# Patient Record
Sex: Male | Born: 1950 | Hispanic: No | Marital: Married | Smoking: Former smoker
Health system: Southern US, Community
[De-identification: ages and names within clinical notes are randomized; demographics above are authoritative.]

## PROBLEM LIST (undated history)

## (undated) DIAGNOSIS — R351 Nocturia: Secondary | ICD-10-CM

## (undated) DIAGNOSIS — T7840XA Allergy, unspecified, initial encounter: Secondary | ICD-10-CM

## (undated) DIAGNOSIS — Z87898 Personal history of other specified conditions: Secondary | ICD-10-CM

## (undated) DIAGNOSIS — N200 Calculus of kidney: Secondary | ICD-10-CM

## (undated) DIAGNOSIS — Z972 Presence of dental prosthetic device (complete) (partial): Secondary | ICD-10-CM

## (undated) DIAGNOSIS — Z87828 Personal history of other (healed) physical injury and trauma: Secondary | ICD-10-CM

## (undated) DIAGNOSIS — Z973 Presence of spectacles and contact lenses: Secondary | ICD-10-CM

## (undated) DIAGNOSIS — Z87442 Personal history of urinary calculi: Secondary | ICD-10-CM

## (undated) DIAGNOSIS — M1990 Unspecified osteoarthritis, unspecified site: Secondary | ICD-10-CM

## (undated) DIAGNOSIS — I10 Essential (primary) hypertension: Secondary | ICD-10-CM

## (undated) DIAGNOSIS — K219 Gastro-esophageal reflux disease without esophagitis: Secondary | ICD-10-CM

## (undated) DIAGNOSIS — N201 Calculus of ureter: Secondary | ICD-10-CM

## (undated) HISTORY — PX: HERNIA REPAIR: SHX51

## (undated) HISTORY — DX: Allergy, unspecified, initial encounter: T78.40XA

## (undated) HISTORY — PX: CYST EXCISION: SHX5701

## (undated) HISTORY — DX: Essential (primary) hypertension: I10

---

## 2009-10-07 DIAGNOSIS — Z8781 Personal history of (healed) traumatic fracture: Secondary | ICD-10-CM

## 2009-10-07 HISTORY — DX: Personal history of (healed) traumatic fracture: Z87.81

## 2010-02-17 ENCOUNTER — Inpatient Hospital Stay (HOSPITAL_COMMUNITY): Admission: EM | Admit: 2010-02-17 | Discharge: 2010-02-23 | Payer: Self-pay | Admitting: Emergency Medicine

## 2010-02-22 ENCOUNTER — Ambulatory Visit: Payer: Self-pay | Admitting: Physical Medicine & Rehabilitation

## 2010-12-24 LAB — BASIC METABOLIC PANEL
BUN: 14 mg/dL (ref 6–23)
CO2: 35 mEq/L — ABNORMAL HIGH (ref 19–32)
Calcium: 8.7 mg/dL (ref 8.4–10.5)
Chloride: 97 mEq/L (ref 96–112)
Creatinine, Ser: 1.15 mg/dL (ref 0.4–1.5)
Creatinine, Ser: 1.22 mg/dL (ref 0.4–1.5)
GFR calc Af Amer: >60 mL/min (ref >60)
GFR calc Af Amer: >60 mL/min (ref >60)
GFR calc non Af Amer: >60 mL/min (ref >60)
Potassium: 4.1 mEq/L (ref 3.5–5.1)
Sodium: 139 mEq/L (ref 135–145)

## 2010-12-24 LAB — CBC
Hemoglobin: 14.4 g/dL (ref 13.0–17.0)
Hemoglobin: 15.5 g/dL (ref 13.0–17.0)
MCHC: 34.4 g/dL (ref 30.0–36.0)
MCHC: 35.1 g/dL (ref 30.0–36.0)
MCV: 94.7 fL (ref 78.0–100.0)
MCV: 95.5 fL (ref 78.0–100.0)
RBC: 4.38 MIL/uL (ref 4.22–5.81)
RDW: 12.8 % (ref 11.5–15.5)
WBC: 9.9 10*3/uL (ref 4.0–10.5)

## 2010-12-24 LAB — MAGNESIUM: Magnesium: 2 mg/dL (ref 1.5–2.5)

## 2010-12-24 LAB — PHOSPHORUS: Phosphorus: 3 mg/dL (ref 2.3–4.6)

## 2013-02-02 ENCOUNTER — Ambulatory Visit
Admission: RE | Admit: 2013-02-02 | Discharge: 2013-02-02 | Disposition: A | Discharge status: Home / Self Care | Payer: 59 | Source: Ambulatory Visit | Attending: Family Medicine | Admitting: Family Medicine

## 2013-02-02 ENCOUNTER — Other Ambulatory Visit: Payer: Self-pay | Admitting: Family Medicine

## 2013-02-02 DIAGNOSIS — R52 Pain, unspecified: Secondary | ICD-10-CM

## 2013-02-02 DIAGNOSIS — R609 Edema, unspecified: Secondary | ICD-10-CM

## 2013-10-18 ENCOUNTER — Other Ambulatory Visit: Payer: Self-pay | Admitting: Family Medicine

## 2013-10-18 ENCOUNTER — Ambulatory Visit
Admission: RE | Admit: 2013-10-18 | Discharge: 2013-10-18 | Disposition: A | Discharge status: Home / Self Care | Payer: 59 | Source: Ambulatory Visit | Attending: Family Medicine | Admitting: Family Medicine

## 2013-10-18 DIAGNOSIS — M159 Polyosteoarthritis, unspecified: Secondary | ICD-10-CM

## 2014-06-07 ENCOUNTER — Ambulatory Visit: Payer: Self-pay | Admitting: Podiatry

## 2014-06-07 ENCOUNTER — Ambulatory Visit (INDEPENDENT_AMBULATORY_CARE_PROVIDER_SITE_OTHER): Payer: 59

## 2014-06-07 VITALS — BP 135/81 | HR 72 | Resp 14 | Ht 70.0 in | Wt 190.0 lb

## 2014-06-07 DIAGNOSIS — IMO0002 Reserved for concepts with insufficient information to code with codable children: Secondary | ICD-10-CM

## 2014-06-07 DIAGNOSIS — M79661 Pain in right lower leg: Secondary | ICD-10-CM

## 2014-06-07 DIAGNOSIS — M19079 Primary osteoarthritis, unspecified ankle and foot: Secondary | ICD-10-CM

## 2014-06-07 DIAGNOSIS — I10 Essential (primary) hypertension: Secondary | ICD-10-CM | POA: Insufficient documentation

## 2014-06-07 DIAGNOSIS — M79609 Pain in unspecified limb: Secondary | ICD-10-CM

## 2014-06-07 DIAGNOSIS — M5416 Radiculopathy, lumbar region: Secondary | ICD-10-CM

## 2014-06-07 DIAGNOSIS — M7751 Other enthesopathy of right foot: Secondary | ICD-10-CM

## 2014-06-07 DIAGNOSIS — R269 Unspecified abnormalities of gait and mobility: Secondary | ICD-10-CM

## 2014-06-07 DIAGNOSIS — M722 Plantar fascial fibromatosis: Secondary | ICD-10-CM

## 2014-06-07 DIAGNOSIS — M7750 Other enthesopathy of unspecified foot: Secondary | ICD-10-CM

## 2014-06-07 DIAGNOSIS — M19071 Primary osteoarthritis, right ankle and foot: Secondary | ICD-10-CM

## 2014-06-07 NOTE — Patient Instructions (Signed)
Plantar Fasciitis  Plantar fasciitis is a common condition that causes foot pain. It is soreness (inflammation) of the band of tough fibrous tissue on the bottom of the foot that runs from the heel bone (calcaneus) to the ball of the foot. The cause of this soreness may be from excessive standing, poor fitting shoes, running on hard surfaces, being overweight, having an abnormal walk, or overuse (this is common in runners) of the painful foot or feet. It is also common in aerobic exercise dancers and ballet dancers.  SYMPTOMS   Most people with plantar fasciitis complain of:   Severe pain in the morning on the bottom of their foot especially when taking the first steps out of bed. This pain recedes after a few minutes of walking.   Severe pain is experienced also during walking following a long period of inactivity.   Pain is worse when walking barefoot or up stairs  DIAGNOSIS    Your caregiver will diagnose this condition by examining and feeling your foot.   Special tests such as X-rays of your foot, are usually not needed.  PREVENTION    Consult a sports medicine professional before beginning a new exercise program.   Walking programs offer a good workout. With walking there is a lower chance of overuse injuries common to runners. There is less impact and less jarring of the joints.   Begin all new exercise programs slowly. If problems or pain develop, decrease the amount of time or distance until you are at a comfortable level.   Wear good shoes and replace them regularly.   Stretch your foot and the heel cords at the back of the ankle (Achilles tendon) both before and after exercise.   Run or exercise on even surfaces that are not hard. For example, asphalt is better than pavement.   Do not run barefoot on hard surfaces.   If using a treadmill, vary the incline.   Do not continue to workout if you have foot or joint problems. Seek professional help if they do not improve.  HOME CARE INSTRUCTIONS     Avoid activities that cause you pain until you recover.   Use ice or cold packs on the problem or painful areas after working out.   Only take over-the-counter or prescription medicines for pain, discomfort, or fever as directed by your caregiver.   Soft shoe inserts or athletic shoes with air or gel sole cushions may be helpful.   If problems continue or become more severe, consult a sports medicine caregiver or your own health care provider. Cortisone is a potent anti-inflammatory medication that may be injected into the painful area. You can discuss this treatment with your caregiver.  MAKE SURE YOU:    Understand these instructions.   Will watch your condition.   Will get help right away if you are not doing well or get worse.  Document Released: 06/18/2001 Document Revised: 12/16/2011 Document Reviewed: 08/17/2008  [employer] Patient Information 2015 [employer]. This information is not intended to replace advice given to you by your health care provider. Make sure you discuss any questions you have with your health care provider.

## 2014-06-07 NOTE — Progress Notes (Signed)
Subjective:    Patient ID: ESPEN BETHEL, male    DOB: Feb 13, 1951, 63 y.o.   MRN: [id number]  HPI Comments: N ankle pain L right ankle D initial injury 40 years ago O falling from a ladder C popping, pain mostly in the arch when not wearing OTC modified orthotics A concrete and weight bearing T OTC orthotics and increasing the amount of lift in the right  Foot Pain Associated symptoms include myalgias.      Review of Systems  Cardiovascular: Positive for leg swelling.  Musculoskeletal: Positive for gait problem and myalgias.  Skin: Positive for color change.       Lower legs  All other systems reviewed and are negative.      Objective:   Physical Exam 63 year old white male well-developed well-nourished oriented x3 presents at this time with minimal clear history of pain to have originally in the injury action foot injury of the arch posse tender ligament injury or fascia type injury many years ago at this time continues to pain points to the inferior heel and arch area also along the lateral, foot Lisfranc's area and lateral heel also has issues with the knee and as well as tissue with a compress lumbar disc to from a 4 years ago that was treated patient declined surgery at the time for his back pain and shows conservative care. At this time is wearing shoes or boots with multiple layers of insoles OTC insoles and comfort cork cushioned a gel insoles all of which have created a significant lift on the heel area. Patient also indicates that his knee turns out and by raising the outside of the foot it's helping his knee and back. Lower extremity findings reveal neurovascular status is intact pedal pulses are palpable DP +2/4 bilateral PT one over 4 bilateral mild varicosities right more so than left or some pigmented lesions in the right lower leg can't rule out venous stasis patient Baird Lyons never broke her injury or trauma that sides and discolored ever since September of vasculitis  or vascular compromise on the right side. Orthopedic exam reveals rectus foot type bilateral ankle mid tarsus subtalar joint motions no there is degenerative changes with asymmetric joint space during first MTP area Lisfranc for 5 on the right and subtalar joint some obliteration the middle facet however the range of motion showed which is present although slightly diminished asymmetric joint space narrowing of the ankle especially with some slight spurring the medial malleolar area no signs of fracture no significant arthrosis or fusion any joints are noted cannot rule out old injury or fracture there may be some widening of the calcaneus and clinical evaluation the right foot and ankle is slightly wider and in more beefy than the left foot and ankle and heel.       Assessment & Plan:  Assessments at this time patient does have significant plantar fasciitis/heel spur syndrome bilateral right more so than left there is also possibly some capsulitis Lisfranc's and subtalar mid tarsus joint as well the ankle to have some abnormality when legs appear to be equal patient may have some lumbar radiculopathy associated with his compress disc I suggested he seek out care for some conservative your orthopedist or chiropractor some point in the future we'll cross that bridge in the future at this time is overcompensating and created too much of a lift in the heel and stability within his shoes. At this time fascial symptomology noted cannot rule out small tendinitis and generalized  osteopenic changes noted on x-rays as well. This time fascial strapping applied to the right foot remove some of the insoles diabetes PVD Insall in a thin comfort insoles in the shoe with a slight heel wedging or lift about half-inch. Patient actually has more than an inch and a half of lift in his shoes otherwise to at this time we'll reevaluate in a week or 2 and assess the benefit of a possible orthoses in the future check the orthotic  coverage in the interim and patient will continue taking Aleve or ibuprofen or Advil as needed for pain maintain strapping for. Weeks 5 days extra graft Alvan Dame DPM

## 2014-06-15 ENCOUNTER — Ambulatory Visit (INDEPENDENT_AMBULATORY_CARE_PROVIDER_SITE_OTHER): Payer: 59

## 2014-06-15 VITALS — BP 109/75 | HR 86 | Resp 14

## 2014-06-15 DIAGNOSIS — R269 Unspecified abnormalities of gait and mobility: Secondary | ICD-10-CM

## 2014-06-15 DIAGNOSIS — M7751 Other enthesopathy of right foot: Secondary | ICD-10-CM

## 2014-06-15 DIAGNOSIS — M722 Plantar fascial fibromatosis: Secondary | ICD-10-CM

## 2014-06-15 DIAGNOSIS — M79609 Pain in unspecified limb: Secondary | ICD-10-CM

## 2014-06-15 DIAGNOSIS — M79661 Pain in right lower leg: Secondary | ICD-10-CM

## 2014-06-15 DIAGNOSIS — M7750 Other enthesopathy of unspecified foot: Secondary | ICD-10-CM

## 2014-06-15 NOTE — Progress Notes (Signed)
   Subjective:    Patient ID: Raymond Wallace, male    DOB: [birth year], 63 y.o.   MRN: [id number]  HPI Comments: Pt states initially received a lot of relief with the right foot taping, but as the taping loosened he did not get the same amount of comfort.  Pt states did remove a good amount of lifting material from his right shoe with some relief and not a noticeable amount of back discomfort.  Pt presents with a strapping type brace that covers his right arch and ankle and states he does get relief with this brace.      Review of Systems no new findings or systemic changes noted     Objective:   Physical Exam The taping that we help as a fascial strapping patient been applying to his own he'll or breaks thready but OTC. This time based on improvement with fascial strapping patient is a candidate for orthoses orthotics not covered by his insurance will try OTC orthotics such as power step which are dispensed at this time. Instructions for use of power step are also dispensed       Assessment & Plan:  Plantar fasciitis/heel spur syndrome capsulitis responded to taping and should benefit from orthoses power step orthotics dispense written instructions for use are given continue with NSAIDs and icing intermittently during the break in period recheck in one to 2 months if it fails to improve for adjustments are needed  Alvan Dame DPM

## 2014-06-15 NOTE — Patient Instructions (Signed)

## 2017-03-06 ENCOUNTER — Encounter (HOSPITAL_BASED_OUTPATIENT_CLINIC_OR_DEPARTMENT_OTHER): Payer: 59 | Attending: Internal Medicine

## 2017-03-06 DIAGNOSIS — I87331 Chronic venous hypertension (idiopathic) with ulcer and inflammation of right lower extremity: Secondary | ICD-10-CM | POA: Diagnosis not present

## 2017-03-06 DIAGNOSIS — L97812 Non-pressure chronic ulcer of other part of right lower leg with fat layer exposed: Secondary | ICD-10-CM | POA: Diagnosis not present

## 2017-03-06 DIAGNOSIS — I10 Essential (primary) hypertension: Secondary | ICD-10-CM | POA: Insufficient documentation

## 2017-03-11 ENCOUNTER — Other Ambulatory Visit: Payer: Self-pay | Admitting: Internal Medicine

## 2017-03-11 DIAGNOSIS — L97911 Non-pressure chronic ulcer of unspecified part of right lower leg limited to breakdown of skin: Secondary | ICD-10-CM

## 2017-03-12 ENCOUNTER — Ambulatory Visit (HOSPITAL_COMMUNITY)
Admission: RE | Admit: 2017-03-12 | Discharge: 2017-03-12 | Disposition: A | Discharge status: Home / Self Care | Payer: 59 | Source: Ambulatory Visit | Attending: Vascular Surgery | Admitting: Vascular Surgery

## 2017-03-12 DIAGNOSIS — L97911 Non-pressure chronic ulcer of unspecified part of right lower leg limited to breakdown of skin: Secondary | ICD-10-CM | POA: Diagnosis present

## 2017-03-13 ENCOUNTER — Encounter (HOSPITAL_BASED_OUTPATIENT_CLINIC_OR_DEPARTMENT_OTHER): Payer: Self-pay

## 2017-03-13 ENCOUNTER — Encounter (HOSPITAL_BASED_OUTPATIENT_CLINIC_OR_DEPARTMENT_OTHER): Payer: 59 | Attending: Internal Medicine

## 2017-03-13 DIAGNOSIS — I87331 Chronic venous hypertension (idiopathic) with ulcer and inflammation of right lower extremity: Secondary | ICD-10-CM | POA: Diagnosis not present

## 2017-03-13 DIAGNOSIS — I10 Essential (primary) hypertension: Secondary | ICD-10-CM | POA: Diagnosis not present

## 2017-03-13 DIAGNOSIS — L97812 Non-pressure chronic ulcer of other part of right lower leg with fat layer exposed: Secondary | ICD-10-CM | POA: Insufficient documentation

## 2017-03-20 DIAGNOSIS — I87331 Chronic venous hypertension (idiopathic) with ulcer and inflammation of right lower extremity: Secondary | ICD-10-CM | POA: Diagnosis not present

## 2017-03-21 ENCOUNTER — Encounter: Payer: Self-pay | Admitting: Vascular Surgery

## 2017-03-27 DIAGNOSIS — I87331 Chronic venous hypertension (idiopathic) with ulcer and inflammation of right lower extremity: Secondary | ICD-10-CM | POA: Diagnosis not present

## 2017-04-03 DIAGNOSIS — I87331 Chronic venous hypertension (idiopathic) with ulcer and inflammation of right lower extremity: Secondary | ICD-10-CM | POA: Diagnosis not present

## 2017-04-04 ENCOUNTER — Encounter: Payer: Self-pay | Admitting: Vascular Surgery

## 2017-04-04 ENCOUNTER — Ambulatory Visit (INDEPENDENT_AMBULATORY_CARE_PROVIDER_SITE_OTHER): Payer: 59 | Admitting: Vascular Surgery

## 2017-04-04 VITALS — BP 120/74 | HR 74 | Temp 97.4°F | Resp 18 | Ht 68.0 in | Wt 187.0 lb

## 2017-04-04 DIAGNOSIS — I872 Venous insufficiency (chronic) (peripheral): Secondary | ICD-10-CM

## 2017-04-04 NOTE — Progress Notes (Signed)
Patient ID: Raymond Wallace, male   DOB: May 26, 1951, 66 y.o.   MRN: [id number]  Reason for Consult: New Evaluation (venous ulcer right lower extremity)   Referred by Maxwell Caulobson, Michael G, MD  Subjective:     HPI:  Raymond Wallace is a 66 y.o. male presents for evaluation of right medial malleolar wound. This first happened a few months back with a low-grade trauma. He initially was wearing compressive stockings and now has been with compressive bandages. The wound care center for 1 month. He does notice that the wound is healing. He had an episode of cellulitis that has resolved and the wound is now shrinking in both depth and diameter. He does not have any fevers or chills. He does have a history of smoker but not currently. He is nondiabetic. He continues to walking T Swart daily.  Past Medical History:  Diagnosis Date  . Allergy   . Hypertension    No family history on file. Past Surgical History:  Procedure Laterality Date  . HERNIA REPAIR      Short Social History:  Social History  Substance Use Topics  . Smoking status: Former Smoker    Quit date: 04/05/1987  . Smokeless tobacco: Never Used  . Alcohol use No    No Known Allergies  Current Outpatient Prescriptions  Medication Sig Dispense Refill  . FLUTICASONE PROPIONATE  HFA IN Inhale into the lungs.    Marland Kitchen. lisinopril (PRINIVIL,ZESTRIL) 5 MG tablet Take 5 mg by mouth daily.    . Naproxen Sodium (ALEVE PO) Take by mouth.     No current facility-administered medications for this visit.     Review of Systems  Constitutional:  Constitutional negative. Respiratory: Respiratory negative.  Cardiovascular: Positive for leg swelling.  GI: Gastrointestinal negative.  Musculoskeletal: Positive for leg pain.  Skin: Positive for wound.  Neurological: Neurological negative. Hematologic: Hematologic/lymphatic negative.  Psychiatric: Psychiatric negative.        Objective:  Objective   Vitals:   04/04/17 1609  BP:  120/74  Pulse: 74  Resp: 18  Temp: 97.4 F (36.3 C)  TempSrc: Oral  SpO2: 96%  Weight: 187 lb (84.8 kg)  Height: 5\' 8"  (1.727 m)   Body mass index is 28.43 kg/m.  Physical Exam  Constitutional: He is oriented to person, place, and time. He appears well-developed.  HENT:  Head: Normocephalic.  Eyes: Pupils are equal, round, and reactive to light.  Neck: Normal range of motion.  Cardiovascular: Normal rate.   Pulses:      Dorsalis pedis pulses are 2+ on the right side, and 2+ on the left side.       Posterior tibial pulses are 2+ on the right side, and 2+ on the left side.  Abdominal: Soft. He exhibits no mass.  Musculoskeletal: Normal range of motion.  Neurological: He is alert and oriented to person, place, and time.  Skin:  Dark discoloration of skin below knee on right 1cm ulceration inferior to medial malleolus on right    Data: Reflux in the right saphenofemoral junction and greater saphenous vein and small saphenous vein. Greatest diameter on the right greater saphenous vein is 1.15 cm. Greatest diameter small saphenous vein 0.47 m.     Assessment/Plan:     66 year old male with C6 venous disease and greater saphenous vein reflux with large saphenous vein on the right. He does not have evidence of arterial disease although he is a former smoker. He has been in compression for a  period of one month and followed at the wound center. He will follow-up in 2 months to discuss venous ablation. I have discussed continuing following at the wound care center, continuing compression and continuing to work with ambulation. All questions are answered he will follow-up for discussion of gsv ablation.     Maeola Harman MD [facility]

## 2017-04-10 ENCOUNTER — Encounter (HOSPITAL_BASED_OUTPATIENT_CLINIC_OR_DEPARTMENT_OTHER): Payer: 59 | Attending: Internal Medicine

## 2017-04-10 DIAGNOSIS — I87331 Chronic venous hypertension (idiopathic) with ulcer and inflammation of right lower extremity: Secondary | ICD-10-CM | POA: Insufficient documentation

## 2017-04-10 DIAGNOSIS — L97812 Non-pressure chronic ulcer of other part of right lower leg with fat layer exposed: Secondary | ICD-10-CM | POA: Diagnosis not present

## 2017-04-10 DIAGNOSIS — I10 Essential (primary) hypertension: Secondary | ICD-10-CM | POA: Diagnosis not present

## 2017-04-24 DIAGNOSIS — I87331 Chronic venous hypertension (idiopathic) with ulcer and inflammation of right lower extremity: Secondary | ICD-10-CM | POA: Diagnosis not present

## 2017-05-01 DIAGNOSIS — I87331 Chronic venous hypertension (idiopathic) with ulcer and inflammation of right lower extremity: Secondary | ICD-10-CM | POA: Diagnosis not present

## 2017-05-08 ENCOUNTER — Encounter (HOSPITAL_BASED_OUTPATIENT_CLINIC_OR_DEPARTMENT_OTHER): Payer: 59 | Attending: Internal Medicine

## 2017-05-08 DIAGNOSIS — I10 Essential (primary) hypertension: Secondary | ICD-10-CM | POA: Insufficient documentation

## 2017-05-08 DIAGNOSIS — I739 Peripheral vascular disease, unspecified: Secondary | ICD-10-CM | POA: Insufficient documentation

## 2017-05-08 DIAGNOSIS — I87301 Chronic venous hypertension (idiopathic) without complications of right lower extremity: Secondary | ICD-10-CM | POA: Diagnosis not present

## 2017-05-08 DIAGNOSIS — Z872 Personal history of diseases of the skin and subcutaneous tissue: Secondary | ICD-10-CM | POA: Diagnosis not present

## 2017-05-08 DIAGNOSIS — Z09 Encounter for follow-up examination after completed treatment for conditions other than malignant neoplasm: Secondary | ICD-10-CM | POA: Diagnosis present

## 2017-06-04 ENCOUNTER — Encounter: Payer: Self-pay | Admitting: Vascular Surgery

## 2017-06-16 ENCOUNTER — Ambulatory Visit (INDEPENDENT_AMBULATORY_CARE_PROVIDER_SITE_OTHER): Payer: 59 | Admitting: Vascular Surgery

## 2017-06-16 DIAGNOSIS — I83891 Varicose veins of right lower extremities with other complications: Secondary | ICD-10-CM | POA: Diagnosis not present

## 2017-06-16 NOTE — Progress Notes (Signed)
Subjective:     Patient ID: Raymond MessingCharles D Wallace, male   DOB: 1951-03-22, 66 y.o.   MRN: [id number]  HPI This 66 year old male returns for 3 month follow-up regarding his venous stasis ulcer in the right leg and painful varicosities with swelling. He was seen by Dr. Pascal LuxKane 2 months ago and has been in the wound center 1 month longer to treat a stasis ulcer in the right ankle. This did eventually heal 4 weeks ago. He continues to wear long-leg elastic compression stockings 20-30 millimeter gradient, and tried elevation and pain medication. He has aching throbbing and burning discomfort in the prominent varicosities below the knee and the chronic skin changes which have been present for the last few years. He has no symptoms and contralateral left leg.  Past Medical History:  Diagnosis Date  . Allergy   . Hypertension     Social History  Substance Use Topics  . Smoking status: Former Smoker    Quit date: 04/05/1987  . Smokeless tobacco: Never Used  . Alcohol use No    No family history on file.  No Known Allergies   Current Outpatient Prescriptions:  .  FLUTICASONE PROPIONATE  HFA IN, Inhale into the lungs., Disp: , Rfl:  .  lisinopril (PRINIVIL,ZESTRIL) 5 MG tablet, Take 5 mg by mouth daily., Disp: , Rfl:  .  Naproxen Sodium (ALEVE PO), Take by mouth., Disp: , Rfl:   There were no vitals filed for this visit.  There is no height or weight on file to calculate BMI.         Review of Systems Denies chest pain, dyspnea on exertion, PND, orthopnea, hemoptysis    Objective:   Physical Exam There were no vitals taken for this visit.  Gen. well-developed well-nourished male no apparent distress alert and oriented 3 Lungs no rhonchi or wheezing Right leg with severe hyperpigmentation lower half of below the knee segment. Ulceration adjacent to right medial malleolus his essentially completely healed about 1 mm defect. Large bulging varicosities in the medial calf of the great  saphenous system. 1+ chronic edema. 3+ dorsalis pedis pulse palpable.  Today I reviewed the venous reflux exam which was performed on 03/12/2017 in our office which revealed gross reflux throughout a large right great saphenous vein supplying these painful varicosities as well as some reflux in the deep system down to the popliteal vein level. There is no DVT.     Assessment:     Recent healing of venous stasis ulcer right ankle with gross reflux and right great saphenous vein and deep venous system. This is also causing symptoms which are affecting patient's daily living and resistant to conservative measures including lung leg elastic compression stockings 20-30 millimeter gradient, elevation, and ibuprofen--CEAP5    Plan:     Patient needs #1 laser ablation right great saphenous vein +10-20 stab phlebectomy of painful varicosities to be performed as a single procedure We'll proceed with precertification to perform this in the near future for vent further venous stasis ulcers and further progression of his severe skin changes

## 2017-06-25 ENCOUNTER — Other Ambulatory Visit: Payer: Self-pay | Admitting: *Deleted

## 2017-06-25 DIAGNOSIS — I83891 Varicose veins of right lower extremities with other complications: Secondary | ICD-10-CM

## 2017-08-05 ENCOUNTER — Encounter: Payer: Self-pay | Admitting: Vascular Surgery

## 2017-08-05 ENCOUNTER — Ambulatory Visit (INDEPENDENT_AMBULATORY_CARE_PROVIDER_SITE_OTHER): Payer: 59 | Admitting: Vascular Surgery

## 2017-08-05 VITALS — BP 95/62 | HR 72 | Temp 97.3°F | Resp 16 | Ht 68.0 in | Wt 187.0 lb

## 2017-08-05 DIAGNOSIS — I83891 Varicose veins of right lower extremities with other complications: Secondary | ICD-10-CM

## 2017-08-05 HISTORY — PX: ENDOVENOUS ABLATION SAPHENOUS VEIN W/ LASER: SUR449

## 2017-08-05 NOTE — Progress Notes (Signed)
Laser Ablation Procedure    Date: 08/05/2017   Raymond Wallace DOB:1950/11/27  Consent signed: Yes    Surgeon:  Dr. Quita SkyeJames D. Hart RochesterLawson  Procedure: Laser Ablation: right Greater Saphenous Vein  BP 95/62 (BP Location: Left Arm, Patient Position: Sitting, Cuff Size: Normal)   Pulse 72   Temp (!) 97.3 F (36.3 C) (Oral)   Resp 16   Ht 5\' 8"  (1.727 m)   Wt 187 lb (84.8 kg)   SpO2 98%   BMI 28.43 kg/m   Tumescent Anesthesia: 475 cc 0.9% NaCl with 25 cc Lidocaine HCL 2% and 15 cc 8.4% NaHCO3  Local Anesthesia: 6 cc Lidocaine HCL and NaHCO3 (ratio 2:1)  Pulsed Mode: 15 watts, 500ms delay, 1.0 duration  Total Energy:  2395Joules            Total Pulses:  160              Total Time: 2:40    Stab Phlebectomy: 10-20 Sites: Calf  Patient tolerated procedure well    Description of Procedure:  After marking the course of the secondary varicosities, the patient was placed on the operating table in the supine position, and the right leg was prepped and draped in sterile fashion.   Local anesthetic was administered and under ultrasound guidance the saphenous vein was accessed with a micro needle and guide wire; then the mirco puncture sheath was placed.  A guide wire was inserted saphenofemoral junction , followed by a 5 french sheath.  The position of the sheath and then the laser fiber below the junction was confirmed using the ultrasound.  Tumescent anesthesia was administered along the course of the saphenous vein using ultrasound guidance. The patient was placed in Trendelenburg position and protective laser glasses were placed on patient and staff, and the laser was fired at 15 watts continuous mode advancing 1-922mm/second for a total of 2395 joules.   For stab phlebectomies, local anesthetic was administered at the previously marked varicosities, and tumescent anesthesia was administered around the vessels.  Ten to 20 stab wounds were made using the tip of an 11 blade. And using the vein  hook, the phlebectomies were performed using a hemostat to avulse the varicosities.  Adequate hemostasis was achieved.     Steri strips were applied to the stab wounds and ABD pads and thigh high compression stockings were applied.  Ace wrap bandages were applied over the phlebectomy sites and at the top of the saphenofemoral junction. Blood loss was less than 15 cc.  The patient ambulated out of the operating room having tolerated the procedure well.

## 2017-08-05 NOTE — Progress Notes (Signed)
Subjective:     Patient ID: Raymond Wallace, male   DOB: 06/05/1951, 66 y.o.   MRN: [id number]  HPI this 66 year old male had laser ablation of the right great saphenous vein from the proximal calf to near the saphenofemoral junction +10-20 stab phlebectomy of painful varicosities performed under local tumescent anesthesia. A total of 2395 J of energy was utilized. He tolerated the procedures well.   Review of Systems     Objective:   Physical Exam BP 95/62 (BP Location: Left Arm, Patient Position: Sitting, Cuff Size: Normal)   Pulse 72   Temp (!) 97.3 F (36.3 C) (Oral)   Resp 16   Ht 5\' 8"  (1.727 m)   Wt 187 lb (84.8 kg)   SpO2 98%   BMI 28.43 kg/m        Assessment:     Well-tolerated laser ablation right great saphenous vein plus multiple stab phlebectomy of painful varicosities performed under local tumescent anesthesia for painful varicosities and history of venous ulcer    Plan:     Return in 1 week for venous duplex exam to confirm closure right great saphenous vein and this will complete patient's treatment regimen

## 2017-08-12 ENCOUNTER — Ambulatory Visit (HOSPITAL_COMMUNITY)
Admission: RE | Admit: 2017-08-12 | Discharge: 2017-08-12 | Disposition: A | Discharge status: Home / Self Care | Payer: 59 | Source: Ambulatory Visit | Attending: Vascular Surgery | Admitting: Vascular Surgery

## 2017-08-12 ENCOUNTER — Encounter: Payer: Self-pay | Admitting: Vascular Surgery

## 2017-08-12 ENCOUNTER — Ambulatory Visit (INDEPENDENT_AMBULATORY_CARE_PROVIDER_SITE_OTHER): Payer: 59 | Admitting: Vascular Surgery

## 2017-08-12 VITALS — BP 129/85 | HR 84 | Temp 97.6°F | Resp 18 | Ht 68.0 in | Wt 187.0 lb

## 2017-08-12 DIAGNOSIS — I83891 Varicose veins of right lower extremities with other complications: Secondary | ICD-10-CM

## 2017-08-12 NOTE — Progress Notes (Signed)
Subjective:     Patient ID: Raymond Wallace, male   DOB: 1951-05-03, 66 y.o.   MRN: [id number]  HPI This 66 year old male returns 1 week post-laser ablation right great saphenous vein with multiple stab phlebectomy of painful varicosities. He has worn long leg elastic compression stocking as instructed and taken ibuprofen. He took the ibuprofen for 2 days and then converted to Aleve. He denies any severe edema in the ankle. He has had minimal discomfort in the medial thigh area. He has no specific complaints.  Past Medical History:  Diagnosis Date  . Allergy   . Hypertension     Social History   Tobacco Use  . Smoking status: Former Smoker    Last attempt to quit: 04/05/1987    Years since quitting: 30.3  . Smokeless tobacco: Never Used  Substance Use Topics  . Alcohol use: No    History reviewed. No pertinent family history.  No Known Allergies   Current Outpatient Medications:  .  FLUTICASONE PROPIONATE  HFA IN, Inhale into the lungs., Disp: , Rfl:  .  lisinopril (PRINIVIL,ZESTRIL) 5 MG tablet, Take 5 mg by mouth daily., Disp: , Rfl:  .  Naproxen Sodium (ALEVE PO), Take by mouth., Disp: , Rfl:   Vitals:   08/12/17 1536  BP: 129/85  Pulse: 84  Resp: 18  Temp: 97.6 F (36.4 C)  TempSrc: Oral  SpO2: 99%  Weight: 187 lb (84.8 kg)  Height: 5\' 8"  (1.727 m)    Body mass index is 28.43 kg/m.         Review of Systems Denies chest pain, dyspnea on exertion, PND, orthopnea, hemoptysis    Objective:   Physical Exam BP 129/85 (BP Location: Left Arm, Patient Position: Sitting, Cuff Size: Normal)   Pulse 84   Temp 97.6 F (36.4 C) (Oral)   Resp 18   Ht 5\' 8"  (1.727 m)   Wt 187 lb (84.8 kg)   SpO2 99%   BMI 28.43 kg/m   Gen. well-developed well-nourished male no apparent distress alert and oriented 3 Lungs no rhonchi or wheezing Right leg with mild discomfort to deep palpation over great saphenous vein in mid to proximal thigh. No ecchymosis noted. Stab  phlebectomy sites healing nicely below the knee. No distal edema noted. 3+ to cells pedis pulse palpable.  Today I ordered a venous duplex exam the right leg which I reviewed and interpreted. There is no DVT. There is total closure of the right great saphenous vein up to near the saphenofemoral junction     Assessment:     Successful laser ablation right great saphenous vein for gross reflux with multiple stab phlebectomy's for painful varicosities-excellent early results    Plan:     #1 patient advised to continue short leg compression stockings after 1 week of long-leg stockings Return to see me on a when necessary basis

## 2020-08-27 ENCOUNTER — Other Ambulatory Visit: Payer: Self-pay

## 2020-08-27 ENCOUNTER — Encounter (HOSPITAL_COMMUNITY): Payer: Self-pay | Admitting: Emergency Medicine

## 2020-08-27 ENCOUNTER — Encounter (HOSPITAL_COMMUNITY)
Admission: EM | Disposition: A | Discharge status: Home / Self Care | Payer: Self-pay | Source: Home / Self Care | Attending: Emergency Medicine

## 2020-08-27 ENCOUNTER — Emergency Department (HOSPITAL_COMMUNITY): Payer: BC Managed Care – PPO | Admitting: Anesthesiology

## 2020-08-27 ENCOUNTER — Observation Stay (HOSPITAL_COMMUNITY): Payer: BC Managed Care – PPO

## 2020-08-27 ENCOUNTER — Ambulatory Visit
Admission: EM | Admit: 2020-08-27 | Discharge: 2020-08-27 | Disposition: A | Discharge status: Home / Self Care | Payer: BC Managed Care – PPO

## 2020-08-27 ENCOUNTER — Emergency Department (HOSPITAL_COMMUNITY): Payer: BC Managed Care – PPO

## 2020-08-27 ENCOUNTER — Observation Stay (HOSPITAL_COMMUNITY)
Admission: EM | Admit: 2020-08-27 | Discharge: 2020-08-28 | Disposition: A | Discharge status: Home / Self Care | Payer: BC Managed Care – PPO | Attending: Urology | Admitting: Urology

## 2020-08-27 DIAGNOSIS — N179 Acute kidney failure, unspecified: Secondary | ICD-10-CM | POA: Insufficient documentation

## 2020-08-27 DIAGNOSIS — Z79899 Other long term (current) drug therapy: Secondary | ICD-10-CM | POA: Insufficient documentation

## 2020-08-27 DIAGNOSIS — Z20822 Contact with and (suspected) exposure to covid-19: Secondary | ICD-10-CM | POA: Insufficient documentation

## 2020-08-27 DIAGNOSIS — Z419 Encounter for procedure for purposes other than remedying health state, unspecified: Secondary | ICD-10-CM

## 2020-08-27 DIAGNOSIS — Z87891 Personal history of nicotine dependence: Secondary | ICD-10-CM | POA: Insufficient documentation

## 2020-08-27 DIAGNOSIS — N201 Calculus of ureter: Principal | ICD-10-CM | POA: Diagnosis present

## 2020-08-27 DIAGNOSIS — I10 Essential (primary) hypertension: Secondary | ICD-10-CM | POA: Insufficient documentation

## 2020-08-27 DIAGNOSIS — R1030 Lower abdominal pain, unspecified: Secondary | ICD-10-CM | POA: Diagnosis present

## 2020-08-27 HISTORY — PX: CYSTOSCOPY W/ URETERAL STENT PLACEMENT: SHX1429

## 2020-08-27 LAB — URINALYSIS, ROUTINE W REFLEX MICROSCOPIC
Bilirubin Urine: NEGATIVE
Glucose, UA: NEGATIVE mg/dL
Hgb urine dipstick: NEGATIVE
Ketones, ur: 20 mg/dL — AB
Leukocytes,Ua: NEGATIVE
Nitrite: NEGATIVE
Protein, ur: NEGATIVE mg/dL
Specific Gravity, Urine: 1.012 (ref 1.005–1.030)
pH: 6 (ref 5.0–8.0)

## 2020-08-27 LAB — CBC WITH DIFFERENTIAL/PLATELET
Abs Immature Granulocytes: 0.03 10*3/uL (ref 0.00–0.07)
Basophils Absolute: 0 10*3/uL (ref 0.0–0.1)
Basophils Relative: 1 %
Eosinophils Absolute: 0.1 10*3/uL (ref 0.0–0.5)
Eosinophils Relative: 2 %
HCT: 47.2 % (ref 39.0–52.0)
Hemoglobin: 15.3 g/dL (ref 13.0–17.0)
Immature Granulocytes: 0 %
Lymphocytes Relative: 21 %
Lymphs Abs: 1.7 10*3/uL (ref 0.7–4.0)
MCH: 31.9 pg (ref 26.0–34.0)
MCHC: 32.4 g/dL (ref 30.0–36.0)
MCV: 98.3 fL (ref 80.0–100.0)
Monocytes Absolute: 1 10*3/uL (ref 0.1–1.0)
Monocytes Relative: 12 %
Neutro Abs: 5.3 10*3/uL (ref 1.7–7.7)
Neutrophils Relative %: 64 %
Platelets: 173 10*3/uL (ref 150–400)
RBC: 4.8 MIL/uL (ref 4.22–5.81)
RDW: 12.2 % (ref 11.5–15.5)
WBC: 8.3 10*3/uL (ref 4.0–10.5)
nRBC: 0 % (ref 0.0–0.2)

## 2020-08-27 LAB — COMPREHENSIVE METABOLIC PANEL
ALT: 21 U/L (ref 0–44)
AST: 21 U/L (ref 15–41)
Albumin: 3.4 g/dL — ABNORMAL LOW (ref 3.5–5.0)
Alkaline Phosphatase: 50 U/L (ref 38–126)
Anion gap: 10 (ref 5–15)
BUN: 21 mg/dL (ref 8–23)
CO2: 28 mmol/L (ref 22–32)
Calcium: 9.1 mg/dL (ref 8.9–10.3)
Chloride: 100 mmol/L (ref 98–111)
Creatinine, Ser: 1.91 mg/dL — ABNORMAL HIGH (ref 0.61–1.24)
GFR, Estimated: 37 mL/min — ABNORMAL LOW (ref >60)
Glucose, Bld: 93 mg/dL (ref 70–99)
Potassium: 4.3 mmol/L (ref 3.5–5.1)
Sodium: 138 mmol/L (ref 135–145)
Total Bilirubin: 1.2 mg/dL (ref 0.3–1.2)
Total Protein: 6.6 g/dL (ref 6.5–8.1)

## 2020-08-27 LAB — RESPIRATORY PANEL BY RT PCR (FLU A&B, COVID)
Influenza A by PCR: NEGATIVE
Influenza B by PCR: NEGATIVE
SARS Coronavirus 2 by RT PCR: NEGATIVE

## 2020-08-27 LAB — LIPASE, BLOOD: Lipase: 30 U/L (ref 11–51)

## 2020-08-27 SURGERY — CYSTOSCOPY, WITH RETROGRADE PYELOGRAM AND URETERAL STENT INSERTION
Anesthesia: General | Laterality: Right

## 2020-08-27 MED ORDER — MIDAZOLAM HCL 2 MG/2ML IJ SOLN
INTRAMUSCULAR | Status: AC
  Filled 2020-08-27: qty 2

## 2020-08-27 MED ORDER — ONDANSETRON HCL 4 MG/2ML IJ SOLN
INTRAMUSCULAR | Status: DC | PRN
  Administered 2020-08-27: 4 mg via INTRAVENOUS

## 2020-08-27 MED ORDER — IOHEXOL 300 MG/ML  SOLN
75.0000 mL | Freq: Once | INTRAMUSCULAR | Status: AC | PRN
  Administered 2020-08-27: 75 mL via INTRAVENOUS

## 2020-08-27 MED ORDER — FENTANYL CITRATE (PF) 250 MCG/5ML IJ SOLN
INTRAMUSCULAR | Status: AC
  Filled 2020-08-27: qty 5

## 2020-08-27 MED ORDER — LIDOCAINE HCL (PF) 2 % IJ SOLN
INTRAMUSCULAR | Status: AC
  Filled 2020-08-27: qty 5

## 2020-08-27 MED ORDER — PROPOFOL 10 MG/ML IV BOLUS
INTRAVENOUS | Status: DC | PRN
  Administered 2020-08-27: 120 mg via INTRAVENOUS

## 2020-08-27 MED ORDER — BELLADONNA ALKALOIDS-OPIUM 16.2-60 MG RE SUPP
1.0000 | Freq: Four times a day (QID) | RECTAL | Status: DC | PRN
  Filled 2020-08-27: qty 1

## 2020-08-27 MED ORDER — SODIUM CHLORIDE 0.9 % IV BOLUS
500.0000 mL | Freq: Once | INTRAVENOUS | Status: AC
  Administered 2020-08-27: 500 mL via INTRAVENOUS

## 2020-08-27 MED ORDER — PROPOFOL 10 MG/ML IV BOLUS
INTRAVENOUS | Status: AC
  Filled 2020-08-27: qty 20

## 2020-08-27 MED ORDER — ONDANSETRON HCL 4 MG/2ML IJ SOLN
INTRAMUSCULAR | Status: AC
  Filled 2020-08-27: qty 2

## 2020-08-27 MED ORDER — CEFAZOLIN SODIUM-DEXTROSE 2-4 GM/100ML-% IV SOLN
INTRAVENOUS | Status: AC
  Filled 2020-08-27: qty 100

## 2020-08-27 MED ORDER — PHENYLEPHRINE HCL (PRESSORS) 10 MG/ML IV SOLN
INTRAVENOUS | Status: DC | PRN
  Administered 2020-08-27 (×2): 40 ug via INTRAVENOUS

## 2020-08-27 MED ORDER — DOCUSATE SODIUM 100 MG PO CAPS: 100.0000 mg | ORAL_CAPSULE | Freq: Every day | ORAL | 0 refills | Status: DC | PRN

## 2020-08-27 MED ORDER — PHENYLEPHRINE 40 MCG/ML (10ML) SYRINGE FOR IV PUSH (FOR BLOOD PRESSURE SUPPORT)
PREFILLED_SYRINGE | INTRAVENOUS | Status: AC
  Filled 2020-08-27: qty 10

## 2020-08-27 MED ORDER — ONDANSETRON HCL 4 MG/2ML IJ SOLN: 4.0000 mg | INTRAMUSCULAR | Status: DC | PRN

## 2020-08-27 MED ORDER — FENTANYL CITRATE (PF) 100 MCG/2ML IJ SOLN: 25.0000 ug | INTRAMUSCULAR | Status: DC | PRN

## 2020-08-27 MED ORDER — ROCURONIUM BROMIDE 10 MG/ML (PF) SYRINGE
PREFILLED_SYRINGE | INTRAVENOUS | Status: AC
  Filled 2020-08-27: qty 10

## 2020-08-27 MED ORDER — OXYCODONE-ACETAMINOPHEN 5-325 MG PO TABS
1.0000 | ORAL_TABLET | ORAL | 0 refills | Status: DC | PRN
Start: 2020-08-27 — End: 2020-10-09

## 2020-08-27 MED ORDER — OXYBUTYNIN CHLORIDE ER 10 MG PO TB24
10.0000 mg | ORAL_TABLET | Freq: Every day | ORAL | Status: DC
  Administered 2020-08-28: 10 mg via ORAL
  Filled 2020-08-27: qty 1

## 2020-08-27 MED ORDER — LIDOCAINE 2% (20 MG/ML) 5 ML SYRINGE
INTRAMUSCULAR | Status: DC | PRN
  Administered 2020-08-27: 60 mg via INTRAVENOUS

## 2020-08-27 MED ORDER — CEFAZOLIN SODIUM-DEXTROSE 2-3 GM-%(50ML) IV SOLR
INTRAVENOUS | Status: DC | PRN
  Administered 2020-08-27: 2 g via INTRAVENOUS

## 2020-08-27 MED ORDER — DIPHENHYDRAMINE HCL 50 MG/ML IJ SOLN: 12.5000 mg | Freq: Four times a day (QID) | INTRAMUSCULAR | Status: DC | PRN

## 2020-08-27 MED ORDER — KETOROLAC TROMETHAMINE 30 MG/ML IJ SOLN: 30.0000 mg | Freq: Once | INTRAMUSCULAR | Status: DC

## 2020-08-27 MED ORDER — FLUTICASONE PROPIONATE 50 MCG/ACT NA SUSP
1.0000 | Freq: Every day | NASAL | Status: DC | PRN
  Filled 2020-08-27: qty 16

## 2020-08-27 MED ORDER — FENTANYL CITRATE (PF) 100 MCG/2ML IJ SOLN
INTRAMUSCULAR | Status: DC | PRN
  Administered 2020-08-27: 50 ug via INTRAVENOUS

## 2020-08-27 MED ORDER — SENNOSIDES-DOCUSATE SODIUM 8.6-50 MG PO TABS: 1.0000 | ORAL_TABLET | Freq: Every evening | ORAL | Status: DC | PRN

## 2020-08-27 MED ORDER — CEPHALEXIN 500 MG PO CAPS: 500.0000 mg | ORAL_CAPSULE | Freq: Two times a day (BID) | ORAL | 0 refills | Status: AC

## 2020-08-27 MED ORDER — STERILE WATER FOR IRRIGATION IR SOLN
Status: DC | PRN
  Administered 2020-08-27: 3000 mL

## 2020-08-27 MED ORDER — DEXAMETHASONE SODIUM PHOSPHATE 10 MG/ML IJ SOLN
INTRAMUSCULAR | Status: AC
  Filled 2020-08-27: qty 1

## 2020-08-27 MED ORDER — MIDAZOLAM HCL 5 MG/5ML IJ SOLN
INTRAMUSCULAR | Status: DC | PRN
  Administered 2020-08-27: 1 mg via INTRAVENOUS

## 2020-08-27 MED ORDER — LACTATED RINGERS IV SOLN: INTRAVENOUS | Status: DC | PRN

## 2020-08-27 MED ORDER — SODIUM CHLORIDE 0.9 % IV SOLN: INTRAVENOUS | Status: DC

## 2020-08-27 MED ORDER — HEPARIN SODIUM (PORCINE) 5000 UNIT/ML IJ SOLN: 5000.0000 [IU] | Freq: Three times a day (TID) | INTRAMUSCULAR | Status: DC

## 2020-08-27 MED ORDER — SUCCINYLCHOLINE CHLORIDE 200 MG/10ML IV SOSY
PREFILLED_SYRINGE | INTRAVENOUS | Status: AC
  Filled 2020-08-27: qty 10

## 2020-08-27 MED ORDER — MORPHINE SULFATE (PF) 4 MG/ML IV SOLN
4.0000 mg | Freq: Once | INTRAVENOUS | Status: AC
  Administered 2020-08-27: 4 mg via INTRAVENOUS
  Filled 2020-08-27: qty 1

## 2020-08-27 MED ORDER — OXYCODONE-ACETAMINOPHEN 5-325 MG PO TABS
1.0000 | ORAL_TABLET | ORAL | Status: DC | PRN
  Administered 2020-08-28: 2 via ORAL
  Filled 2020-08-27: qty 2

## 2020-08-27 MED ORDER — DEXAMETHASONE SODIUM PHOSPHATE 10 MG/ML IJ SOLN
INTRAMUSCULAR | Status: DC | PRN
  Administered 2020-08-27: 5 mg via INTRAVENOUS

## 2020-08-27 MED ORDER — ACETAMINOPHEN 325 MG PO TABS: 650.0000 mg | ORAL_TABLET | ORAL | Status: DC | PRN

## 2020-08-27 MED ORDER — IOHEXOL 300 MG/ML  SOLN
INTRAMUSCULAR | Status: DC | PRN
  Administered 2020-08-27: 5 mL via URETHRAL

## 2020-08-27 MED ORDER — LISINOPRIL 5 MG PO TABS
5.0000 mg | ORAL_TABLET | Freq: Every day | ORAL | Status: DC
  Administered 2020-08-28: 5 mg via ORAL
  Filled 2020-08-27: qty 1

## 2020-08-27 MED ORDER — DIPHENHYDRAMINE HCL 12.5 MG/5ML PO ELIX: 12.5000 mg | ORAL_SOLUTION | Freq: Four times a day (QID) | ORAL | Status: DC | PRN

## 2020-08-27 MED ORDER — MORPHINE SULFATE (PF) 2 MG/ML IV SOLN: 2.0000 mg | INTRAVENOUS | Status: DC | PRN

## 2020-08-27 MED ORDER — BISACODYL 5 MG PO TBEC: 5.0000 mg | DELAYED_RELEASE_TABLET | Freq: Every day | ORAL | Status: DC | PRN

## 2020-08-27 SURGICAL SUPPLY — 30 items
BAG URO CATCHER STRL LF (MISCELLANEOUS) ×3 IMPLANT
CATH FOLEY 2W 5CC 18FR LF (CATHETERS) ×3 IMPLANT
CATH INTERMIT  6FR 70CM (CATHETERS) IMPLANT
CATH URET 5FR 28IN OPEN ENDED (CATHETERS) ×3 IMPLANT
CLOTH BEACON ORANGE TIMEOUT ST (SAFETY) ×3 IMPLANT
EXTRACTOR STONE NITINOL NGAGE (UROLOGICAL SUPPLIES) IMPLANT
GLOVE BIOGEL M 7.0 STRL (GLOVE) ×3 IMPLANT
GLOVE BIOGEL PI IND STRL 6.5 (GLOVE) ×1 IMPLANT
GLOVE BIOGEL PI INDICATOR 6.5 (GLOVE) ×2
GLOVE SURG SS PI 7.0 STRL IVOR (GLOVE) ×3 IMPLANT
GLOVE SURG UNDER POLY LF SZ7 (GLOVE) ×3 IMPLANT
GOWN STRL REUS W/TWL LRG LVL3 (GOWN DISPOSABLE) ×3 IMPLANT
GUIDEWIRE ANG ZIPWIRE 038X150 (WIRE) ×3 IMPLANT
GUIDEWIRE STR DUAL SENSOR (WIRE) ×6 IMPLANT
IV NS 1000ML (IV SOLUTION) ×2
IV NS 1000ML BAXH (IV SOLUTION) ×1 IMPLANT
KIT TURNOVER KIT A (KITS) IMPLANT
LASER FIB FLEXIVA PULSE ID 365 (Laser) IMPLANT
MANIFOLD NEPTUNE II (INSTRUMENTS) ×3 IMPLANT
PACK CYSTO (CUSTOM PROCEDURE TRAY) ×3 IMPLANT
SHEATH URETERAL 12FRX28CM (UROLOGICAL SUPPLIES) IMPLANT
SHEATH URETERAL 12FRX35CM (MISCELLANEOUS) IMPLANT
SHEATH URETERAL 12FRX55CM (UROLOGICAL SUPPLIES) IMPLANT
STENT URET 6FRX26 CONTOUR (STENTS) ×3 IMPLANT
SYR CONTROL 10ML LL (SYRINGE) ×3 IMPLANT
TRACTIP FLEXIVA PULS ID 200XHI (Laser) IMPLANT
TRACTIP FLEXIVA PULSE ID 200 (Laser)
TUBING CONNECTING 10 (TUBING) ×2 IMPLANT
TUBING CONNECTING 10' (TUBING) ×1
TUBING UROLOGY SET (TUBING) ×3 IMPLANT

## 2020-08-27 NOTE — Transfer of Care (Signed)
Immediate Anesthesia Transfer of Care Note  Patient: Raymond Wallace  Procedure(s) Performed: CYSTOSCOPY WITH RETROGRADE PYELOGRAM/URETERAL STENT PLACEMENT (Right )  Patient Location: PACU  Anesthesia Type:General  Level of Consciousness: awake  Airway & Oxygen Therapy: Patient Spontanous Breathing  Post-op Assessment: Report given to RN and Post -op Vital signs reviewed and stable  Post vital signs: Reviewed and stable  Last Vitals:  Vitals Value Taken Time  BP    Temp    Pulse 86 08/27/20 2326  Resp 14 08/27/20 2326  SpO2 92 % 08/27/20 2326  Vitals shown include unvalidated device data.  Last Pain:  Vitals:   08/27/20 1317  TempSrc: Oral  PainSc:          Complications: No complications documented.

## 2020-08-27 NOTE — Anesthesia Preprocedure Evaluation (Addendum)
Anesthesia Evaluation  Patient identified by MRN, date of birth, ID band Patient awake    Reviewed: Allergy & Precautions, H&P , NPO status , Patient's Chart, lab work & pertinent test results  Airway Mallampati: II  TM Distance: >3 FB Neck ROM: Full    Dental no notable dental hx. (+) Teeth Intact, Dental Advisory Given   Pulmonary neg pulmonary ROS, former smoker,    Pulmonary exam normal breath sounds clear to auscultation       Cardiovascular hypertension, Pt. on medications  Rhythm:Regular Rate:Normal     Neuro/Psych negative neurological ROS  negative psych ROS   GI/Hepatic negative GI ROS, Neg liver ROS,   Endo/Other  negative endocrine ROS  Renal/GU negative Renal ROS  negative genitourinary   Musculoskeletal   Abdominal   Peds  Hematology negative hematology ROS (+)   Anesthesia Other Findings   Reproductive/Obstetrics negative OB ROS                            Anesthesia Physical Anesthesia Plan  ASA: II  Anesthesia Plan: General   Post-op Pain Management:    Induction: Intravenous  PONV Risk Score and Plan: 3 and Ondansetron, Dexamethasone and Treatment may vary due to age or medical condition  Airway Management Planned: LMA  Additional Equipment:   Intra-op Plan:   Post-operative Plan: Extubation in OR  Informed Consent: I have reviewed the patients History and Physical, chart, labs and discussed the procedure including the risks, benefits and alternatives for the proposed anesthesia with the patient or authorized representative who has indicated his/her understanding and acceptance.     Dental advisory given  Plan Discussed with: CRNA  Anesthesia Plan Comments:        Anesthesia Quick Evaluation

## 2020-08-27 NOTE — Anesthesia Procedure Notes (Signed)
Procedure Name: LMA Insertion Date/Time: 08/27/2020 10:39 PM Performed by: Edmonia Caprio, CRNA Pre-anesthesia Checklist: Patient identified, Emergency Drugs available, Suction available, Patient being monitored and Timeout performed Patient Re-evaluated:Patient Re-evaluated prior to induction Oxygen Delivery Method: Circle system utilized Preoxygenation: Pre-oxygenation with 100% oxygen Induction Type: IV induction Ventilation: Mask ventilation without difficulty LMA: LMA inserted LMA Size: 4.0 Number of attempts: 1 Placement Confirmation: positive ETCO2 and breath sounds checked- equal and bilateral Tube secured with: Tape Dental Injury: Teeth and Oropharynx as per pre-operative assessment

## 2020-08-27 NOTE — ED Triage Notes (Signed)
Pt arrives to ED with chief complaint of constipation and lower abdominal pain. He has not had a normal bowel movement since Thursday, he has taken miralax and 2 fleet enemas and resulted in a small amount of hard stool. Denies any emesis but mild nausea, and deceased appetite. No fevers.

## 2020-08-27 NOTE — Discharge Instructions (Signed)
   Activity:  You are encouraged to ambulate frequently (about every hour during waking hours) to help prevent blood clots from forming in your legs or lungs.  However, you should not engage in any heavy lifting (> 10-15 lbs), strenuous activity, or straining.   Diet: You should advance your diet as instructed by your physician.  It will be normal to have some bloating, nausea, and abdominal discomfort intermittently.   Prescriptions:  You will be provided a prescription for pain medication to take as needed.  If your pain is not severe enough to require the prescription pain medication, you may take extra strength Tylenol instead which will have less side effects.  You should also take a prescribed stool softener to avoid straining with bowel movements as the prescription pain medication may constipate you.   What to call us about: You should call the office [phone]) if you develop fever > 101 or develop persistent vomiting. Activity:  You are encouraged to ambulate frequently (about every hour during waking hours) to help prevent blood clots from forming in your legs or lungs.  However, you should not engage in any heavy lifting (> 10-15 lbs), strenuous activity, or straining.  You have a right ureteral stent in place draining your kidney. This is temporary and will be removed/exchanged during definitive treatment of stone.  You also have a foley catheter draining your urethra. 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 This can be removed at home with provided syringe or f/u in office on Wednesday for removal.

## 2020-08-27 NOTE — ED Provider Notes (Signed)
[facility] EMERGENCY DEPARTMENT Provider Note   CSN: [id number] Arrival date & time: 08/27/20  1307     History Chief Complaint  Patient presents with  . Abdominal Pain  . Constipation    Raymond Wallace is a 69 y.o. male with history of HTN presents to ER for evaluation of abdominal pain associated with constipation since Thursday. Abdominal pain is "burning" constant, lower. Worse with movement, palpation.  Last BM was Thursday.  Has been straining to try to have a BM but only voiding small pebble like firm stool, last this morning. No melena, no blood. No pain in rectum.  Used miralax capful twice the last two day and used two fleet enemas without relief.  Has not passed gas today. No fever, nausea, vomiting. No dysuria, hematuria, frequency or changes in urine output but states voiding urine makes his abdominal pain worse.  Reports remote history of L2 fracture many years ago but no new falls. No significant back pain. No extremity weakness or numbness. Usually has a daily BM and no issues with constipation. In his 37s he had an issue with his intestines "twisted intestines" that was treated with a shot. Has had hernia surgery.   HPI     Past Medical History:  Diagnosis Date  . Allergy   . Hypertension     Patient Active Problem List   Diagnosis Date Noted  . Varicose veins of right lower extremity with complications 06/16/2017  . Hypertension 06/07/2014    Past Surgical History:  Procedure Laterality Date  . ENDOVENOUS ABLATION SAPHENOUS VEIN W/ LASER Right 08/05/2017   endovenous laser ablation right greater saphenous vein and stab phlebectomy 10-20 incisions right leg by Josephina Gip MD   . HERNIA REPAIR         History reviewed. No pertinent family history.  Social History   Tobacco Use  . Smoking status: Former Smoker    Quit date: 04/05/1987    Years since quitting: 33.4  . Smokeless tobacco: Never Used  Vaping Use  . Vaping Use:  Never used  Substance Use Topics  . Alcohol use: No  . Drug use: No    Home Medications Prior to Admission medications   Medication Sig Start Date End Date Taking? Authorizing Provider  Coenzyme Q10 300 MG CAPS Take 300 mg by mouth daily.   Yes [provider]  fluticasone (FLONASE) 50 MCG/ACT nasal spray Place 1-2 sprays into both nostrils daily as needed for allergies or rhinitis.   Yes [provider]  Glucosamine HCl (GLUCOSAMINE PO) Take 2 tablets by mouth daily.   Yes [provider]  lisinopril (PRINIVIL,ZESTRIL) 5 MG tablet Take 5 mg by mouth daily.   Yes [provider]  Multiple Vitamin (MULTIVITAMIN WITH MINERALS) TABS tablet Take 1 tablet by mouth daily.   Yes [provider]  naproxen sodium (ALEVE) 220 MG tablet Take 220 mg by mouth daily.   Yes [provider]  Probiotic Product (PROBIOTIC PO) Take 1 capsule by mouth daily.   Yes [provider]    Allergies    Patient has no known allergies.  Review of Systems   Review of Systems  Gastrointestinal: Positive for abdominal pain and constipation.  All other systems reviewed and are negative.   Physical Exam Updated Vital Signs BP 122/82   Pulse 76   Temp 98.2 F (36.8 C) (Oral)   Resp 14   SpO2 92%   Physical Exam Vitals and nursing note  reviewed.  Constitutional:      Appearance: He is well-developed.     Comments: Non toxic.  HENT:     Head: Normocephalic and atraumatic.     Nose: Nose normal.  Eyes:     Conjunctiva/sclera: Conjunctivae normal.  Cardiovascular:     Rate and Rhythm: Normal rate and regular rhythm.     Heart sounds: Normal heart sounds.  Pulmonary:     Effort: Pulmonary effort is normal.     Breath sounds: Normal breath sounds.  Abdominal:     General: Bowel sounds are normal.     Palpations: Abdomen is soft.     Tenderness: There is abdominal tenderness (umbilical and right mid abdominal). There is guarding.      Comments: Soft. No distention or fluid wave. Active high pitched BS in all quadrants. No suprapubic or CVA tenderness. Negative Murphy's and McBurney's  Genitourinary:    Comments:  DRE with RN at bedside.  Good rectal tone. No stool palpated in rectal vault.  Musculoskeletal:        General: Normal range of motion.     Cervical back: Normal range of motion.  Skin:    General: Skin is warm and dry.     Capillary Refill: Capillary refill takes less than 2 seconds.  Neurological:     Mental Status: He is alert.  Psychiatric:        Behavior: Behavior normal.     ED Results / Procedures / Treatments   Labs (all labs ordered are listed, but only abnormal results are displayed) Labs Reviewed  COMPREHENSIVE METABOLIC PANEL - Abnormal; Notable for the following components:      Result Value   Creatinine, Ser 1.91 (*)    Albumin 3.4 (*)    GFR, Estimated 37 (*)    All other components within normal limits  URINALYSIS, ROUTINE W REFLEX MICROSCOPIC - Abnormal; Notable for the following components:   Ketones, ur 20 (*)    All other components within normal limits  RESPIRATORY PANEL BY RT PCR (FLU A&B, COVID)  LIPASE, BLOOD  CBC WITH DIFFERENTIAL/PLATELET    EKG None  Radiology CT ABDOMEN PELVIS W CONTRAST  Result Date: 08/27/2020 CLINICAL DATA:  Abdominal pain and constipation for 3 days. EXAM: CT ABDOMEN AND PELVIS WITH CONTRAST TECHNIQUE: Multidetector CT imaging of the abdomen and pelvis was performed using the standard protocol following bolus administration of intravenous contrast. CONTRAST:  75mL OMNIPAQUE IOHEXOL 300 MG/ML  SOLN COMPARISON:  None. FINDINGS: Lower chest: Bibasilar subpleural atelectasis. No pleural effusions or worrisome pulmonary lesions. The heart is normal in size. No pericardial effusion. Small hiatal hernia is noted. Hepatobiliary: Small scattered hepatic cysts. No worrisome hepatic lesions or intrahepatic biliary dilatation. The gallbladder appears normal.  No common bile duct dilatation. Pancreas: No mass, inflammation or ductal dilatation. Spleen: Normal size. No focal lesions. Adrenals/Urinary Tract: The adrenal glands are unremarkable. Right-sided hydronephrosis and decreased/delayed perfusion of the right kidney consistent with obstruction. The cause is a 7 x 6 mm calculus in the proximal mid ureter located at the L3-4 disc space level. Right renal calculi are also noted. Cortical defects involving the right kidney likely areas of previous infection. No left-sided renal or ureteral calculi. Defect involving the lower pole region likely related to prior infection. The bladder is unremarkable. No bladder mass or calculi. Stomach/Bowel: The stomach, duodenum, small bowel and colon unremarkable. No acute inflammatory changes, mass lesions or obstructive findings. The terminal ileum is normal. The appendix is normal.  Scattered descending colon and sigmoid colon diverticulosis but no findings for acute diverticulitis. Vascular/Lymphatic: Moderate tortuosity and calcification of the abdominal aorta. No aneurysm or dissection. The branch vessels are patent. No mesenteric or retroperitoneal mass or adenopathy. Reproductive: The prostate gland and seminal vesicles are unremarkable. Other: No pelvic mass or adenopathy. No free pelvic fluid collections. No inguinal mass or adenopathy. No abdominal wall hernia or subcutaneous lesions. Musculoskeletal: Remote L2 compression fracture with vertebral plana appearance and moderate retropulsion and moderate canal compromise. No acute bony findings. No worrisome bone lesions. IMPRESSION: 1. 7 x 6 mm proximal mid right ureteral calculus (L3-4 level) causing moderate to high-grade obstruction. 2. Right renal calculi. 3. No other significant abdominal/pelvic findings, mass lesions or adenopathy. 4. Remote L2 compression fracture with vertebral plana appearance and moderate retropulsion and moderate canal compromise. 5. Small hiatal  hernia. 6. Aortic atherosclerosis. Aortic Atherosclerosis (ICD10-I70.0). Electronically Signed   By: Rudie Meyer M.D.   On: 08/27/2020 18:17    Procedures Procedures (including critical care time)  Medications Ordered in ED Medications  iohexol (OMNIPAQUE) 300 MG/ML solution 75 mL (75 mLs Intravenous Contrast Given 08/27/20 1745)  sodium chloride 0.9 % bolus 500 mL (0 mLs Intravenous Stopped 08/27/20 2129)  morphine 4 MG/ML injection 4 mg (4 mg Intravenous Given 08/27/20 1853)    ED Course  I have reviewed the triage vital signs and the nursing notes.  Pertinent labs & imaging results that were available during my care of the patient were reviewed by me and considered in my medical decision making (see chart for details).  Clinical Course as of Aug 27 2146  Wynelle Link Aug 27, 2020  1849 IMPRESSION: 1. 7 x 6 mm proximal mid right ureteral calculus (L3-4 level) causing moderate to high-grade obstruction. 2. Right renal calculi. 3. No other significant abdominal/pelvic findings, mass lesions or adenopathy. 4. Remote L2 compression fracture with vertebral plana appearance and moderate retropulsion and moderate canal compromise. 5. Small hiatal hernia. 6. Aortic atherosclerosis.  Aortic Atherosclerosis (ICD10-I70.0).  CT ABDOMEN PELVIS W CONTRAST [CG]  1849 Creatinine(!): 1.91 [CG]  1849 WBC: 8.3 [CG]    Clinical Course User Index [CG] Liberty Handy, PA-C   MDM Rules/Calculators/A&P                          Ddx includes constipation vs fecal impaction vs SBO.  No fever, nausea, vomiting. Reports worsening abdominal pain with urination but no frank dysuria, hematuria, urgency. No back pain, falls, trauma, extremity weakness or numbness. History of kidney stones.   Labs ordered: CBC, CMP, lipase, urinalysis  DRE to attempt for possible disimpaction performed by me.  There was no stool in rectal vault or other physical abnormalities noted.  Given age, associated abdominal pain,  no previous issues with constipation low threshold to obtain CT A/P for evaluation of SBO, malignancy.  Imaging ordered: CT A/P.  ER work-up worsening visualized and interpreted.  Labs reviewed-mild AKI with creatinine 1.91.  No leukocytosis.  Urinalysis with 20 ketones but no infection, RBCs.  Imaging reviewed-CT A/P shows 7 x 6 mm right mid ureteral stone with moderate/high-grade obstruction.  No SBO, appendicitis, diverticulitis or significant stool burden.  Medicines given: Morphine, IV fluids 500 cc NS.  Consults in the ED: Spoke to Dr. Cardell Peach with urology who has reviewed patient's chart, will take patient to the OR for stone removal/retrieval.  Patient reevaluated and reports mild improvement in pain.  No clinical decline.  Updated  on plan of care and he is agreeable.  Patient awaiting transport to the OR with urology.  Respiratory panel ordered. Final Clinical Impression(s) / ED Diagnoses Final diagnoses:  Right ureteral stone  AKI (acute kidney injury) [facility])    Rx / DC Orders ED Discharge Orders    None       Jerrell Mylar 08/27/20 2147    Alvira Monday, MD 08/28/20 1652

## 2020-08-27 NOTE — H&P (Signed)
Urology Consult   Physician requesting consult: Erin Schlossman, MD  Reason for consult: Right ureteral stone with AKI  History of Present Illness: Raymond Wallace is a 69 y.o. who presented the ED with right flank pain and constipation for the past 4 days.  He localizes the pain to his right lower abdomen states this is burning in nature.  It is worse with movement.  He denies fevers or chills.  He denies dysuria or hematuria.  No urinary frequency, urgency or changes in voiding.  He denies a history of urolithiasis.  CT A/P 08/07/2020 with a 7 x 6 mm proximal mid right ureteral calculus causing moderate to high-grade obstruction.  He denies a history of voiding or storage urinary symptoms, hematuria, UTIs, STDs, urolithiasis, GU malignancy/trauma/surgery.  Past Medical History:  Diagnosis Date  . Allergy   . Hypertension     Past Surgical History:  Procedure Laterality Date  . ENDOVENOUS ABLATION SAPHENOUS VEIN W/ LASER Right 08/05/2017   endovenous laser ablation right greater saphenous vein and stab phlebectomy 10-20 incisions right leg by James Lawson MD   . HERNIA REPAIR      Current Hospital Medications:  Home Meds:  No current facility-administered medications on file prior to encounter.   Current Outpatient Medications on File Prior to Encounter  Medication Sig Dispense Refill  . Coenzyme Q10 300 MG CAPS Take 300 mg by mouth daily.    . fluticasone (FLONASE) 50 MCG/ACT nasal spray Place 1-2 sprays into both nostrils daily as needed for allergies or rhinitis.    . Glucosamine HCl (GLUCOSAMINE PO) Take 2 tablets by mouth daily.    . lisinopril (PRINIVIL,ZESTRIL) 5 MG tablet Take 5 mg by mouth daily.    . Multiple Vitamin (MULTIVITAMIN WITH MINERALS) TABS tablet Take 1 tablet by mouth daily.    . naproxen sodium (ALEVE) 220 MG tablet Take 220 mg by mouth daily.    . Probiotic Product (PROBIOTIC PO) Take 1 capsule by mouth daily.       Scheduled Meds: Continuous  Infusions: PRN Meds:.  Allergies: No Known Allergies  No family history on file.  Social History:  reports that he quit smoking about 33 years ago. He has never used smokeless tobacco. He reports that he does not drink alcohol and does not use drugs.  ROS: A complete review of systems was performed.  All systems are negative except for pertinent findings as noted.  Physical Exam:  Vital signs in last 24 hours: Temp:  [98.2 F (36.8 C)] 98.2 F (36.8 C) (11/21 1317) Pulse Rate:  [65-85] 84 (11/21 1915) Resp:  [13-20] 15 (11/21 1915) BP: (124-171)/(86-113) 144/87 (11/21 1915) SpO2:  [92 %-100 %] 92 % (11/21 1915) Constitutional:  Alert and oriented, No acute distress Cardiovascular: Regular rate and rhythm Respiratory: Normal respiratory effort, Lungs clear bilaterally GI: Abdomen is soft, nontender, nondistended, no abdominal masses GU: No CVA tenderness Neurologic: Grossly intact, no focal deficits Psychiatric: Normal mood and affect  Laboratory Data:  Recent Labs    08/27/20 1628  WBC 8.3  HGB 15.3  HCT 47.2  PLT 173    Recent Labs    08/27/20 1628  NA 138  K 4.3  CL 100  GLUCOSE 93  BUN 21  CALCIUM 9.1  CREATININE 1.91*     Results for orders placed or performed during the hospital encounter of 08/27/20 (from the past 24 hour(s))  Comprehensive metabolic panel     Status: Abnormal   Collection Time: 08/27/20    4:28 PM  Result Value Ref Range   Sodium 138 135 - 145 mmol/L   Potassium 4.3 3.5 - 5.1 mmol/L   Chloride 100 98 - 111 mmol/L   CO2 28 22 - 32 mmol/L   Glucose, Bld 93 70 - 99 mg/dL   BUN 21 8 - 23 mg/dL   Creatinine, Ser 6.37 (H) 0.61 - 1.24 mg/dL   Calcium 9.1 8.9 - 85.8 mg/dL   Total Protein 6.6 6.5 - 8.1 g/dL   Albumin 3.4 (L) 3.5 - 5.0 g/dL   AST 21 15 - 41 U/L   ALT 21 0 - 44 U/L   Alkaline Phosphatase 50 38 - 126 U/L   Total Bilirubin 1.2 0.3 - 1.2 mg/dL   GFR, Estimated 37 (L) >60 mL/min   Anion gap 10 5 - 15  Lipase, blood      Status: None   Collection Time: 08/27/20  4:28 PM  Result Value Ref Range   Lipase 30 11 - 51 U/L  CBC with Diff     Status: None   Collection Time: 08/27/20  4:28 PM  Result Value Ref Range   WBC 8.3 4.0 - 10.5 K/uL   RBC 4.80 4.22 - 5.81 MIL/uL   Hemoglobin 15.3 13.0 - 17.0 g/dL   HCT 85.0 39 - 52 %   MCV 98.3 80.0 - 100.0 fL   MCH 31.9 26.0 - 34.0 pg   MCHC 32.4 30.0 - 36.0 g/dL   RDW 27.7 41.2 - 87.8 %   Platelets 173 150 - 400 K/uL   nRBC 0.0 0.0 - 0.2 %   Neutrophils Relative % 64 %   Neutro Abs 5.3 1.7 - 7.7 K/uL   Lymphocytes Relative 21 %   Lymphs Abs 1.7 0.7 - 4.0 K/uL   Monocytes Relative 12 %   Monocytes Absolute 1.0 0.1 - 1.0 K/uL   Eosinophils Relative 2 %   Eosinophils Absolute 0.1 0.0 - 0.5 K/uL   Basophils Relative 1 %   Basophils Absolute 0.0 0.0 - 0.1 K/uL   Immature Granulocytes 0 %   Abs Immature Granulocytes 0.03 0.00 - 0.07 K/uL  Urinalysis, Routine w reflex microscopic Urine, Clean Catch     Status: Abnormal   Collection Time: 08/27/20  4:29 PM  Result Value Ref Range   Color, Urine YELLOW YELLOW   APPearance CLEAR CLEAR   Specific Gravity, Urine 1.012 1.005 - 1.030   pH 6.0 5.0 - 8.0   Glucose, UA NEGATIVE NEGATIVE mg/dL   Hgb urine dipstick NEGATIVE NEGATIVE   Bilirubin Urine NEGATIVE NEGATIVE   Ketones, ur 20 (A) NEGATIVE mg/dL   Protein, ur NEGATIVE NEGATIVE mg/dL   Nitrite NEGATIVE NEGATIVE   Leukocytes,Ua NEGATIVE NEGATIVE   No results found for this or any previous visit (from the past 240 hour(s)).  Renal Function: Recent Labs    08/27/20 1628  CREATININE 1.91*   CrCl cannot be calculated (Unknown ideal weight.).  Radiologic Imaging: CT ABDOMEN PELVIS W CONTRAST  Result Date: 08/27/2020 CLINICAL DATA:  Abdominal pain and constipation for 3 days. EXAM: CT ABDOMEN AND PELVIS WITH CONTRAST TECHNIQUE: Multidetector CT imaging of the abdomen and pelvis was performed using the standard protocol following bolus administration of  intravenous contrast. CONTRAST:  10mL OMNIPAQUE IOHEXOL 300 MG/ML  SOLN COMPARISON:  None. FINDINGS: Lower chest: Bibasilar subpleural atelectasis. No pleural effusions or worrisome pulmonary lesions. The heart is normal in size. No pericardial effusion. Small hiatal hernia is noted. Hepatobiliary: Small scattered hepatic  cysts. No worrisome hepatic lesions or intrahepatic biliary dilatation. The gallbladder appears normal. No common bile duct dilatation. Pancreas: No mass, inflammation or ductal dilatation. Spleen: Normal size. No focal lesions. Adrenals/Urinary Tract: The adrenal glands are unremarkable. Right-sided hydronephrosis and decreased/delayed perfusion of the right kidney consistent with obstruction. The cause is a 7 x 6 mm calculus in the proximal mid ureter located at the L3-4 disc space level. Right renal calculi are also noted. Cortical defects involving the right kidney likely areas of previous infection. No left-sided renal or ureteral calculi. Defect involving the lower pole region likely related to prior infection. The bladder is unremarkable. No bladder mass or calculi. Stomach/Bowel: The stomach, duodenum, small bowel and colon unremarkable. No acute inflammatory changes, mass lesions or obstructive findings. The terminal ileum is normal. The appendix is normal. Scattered descending colon and sigmoid colon diverticulosis but no findings for acute diverticulitis. Vascular/Lymphatic: Moderate tortuosity and calcification of the abdominal aorta. No aneurysm or dissection. The branch vessels are patent. No mesenteric or retroperitoneal mass or adenopathy. Reproductive: The prostate gland and seminal vesicles are unremarkable. Other: No pelvic mass or adenopathy. No free pelvic fluid collections. No inguinal mass or adenopathy. No abdominal wall hernia or subcutaneous lesions. Musculoskeletal: Remote L2 compression fracture with vertebral plana appearance and moderate retropulsion and moderate canal  compromise. No acute bony findings. No worrisome bone lesions. IMPRESSION: 1. 7 x 6 mm proximal mid right ureteral calculus (L3-4 level) causing moderate to high-grade obstruction. 2. Right renal calculi. 3. No other significant abdominal/pelvic findings, mass lesions or adenopathy. 4. Remote L2 compression fracture with vertebral plana appearance and moderate retropulsion and moderate canal compromise. 5. Small hiatal hernia. 6. Aortic atherosclerosis. Aortic Atherosclerosis (ICD10-I70.0). Electronically Signed   By: P.  Gallerani M.D.   On: 08/27/2020 18:17    I independently reviewed the above imaging studies.  Impression/Recommendation 1. Right ureteral stone causing AKI: CT A/P 08/07/2020 with a 7 x 6 mm proximal mid right ureteral calculus causing moderate to high-grade obstruction. Afebrile, no leukocytosis, creatinine 1.9 from baseline 1.1-1.2, urinalysis negative for infection. 2.  AKI: Creatinine 1.9 from baseline 1.1-1.2  -The risks, benefits and alternatives of cystoscopy with right JJ stent placement was discussed with the patient.  Risks include, but are not limited to: bleeding, urinary tract infection, ureteral injury, ureteral stricture disease, chronic pain, urinary symptoms, bladder injury, stent migration, the need for nephrostomy tube placement, MI, CVA, DVT, PE and the inherent risks with general anesthesia.  The patient voices understanding and wishes to proceed.  Matt R. Moana Munford MD [facility]  Pager: [phone]   CC: Erin Schlossman, MD  

## 2020-08-27 NOTE — Op Note (Signed)
Operative Note  Preoperative diagnosis:  1.  Right ureteral stone 2. Acute kidney injury  Postoperative diagnosis: 1.  Right ureteral stone 2. Acute kidney injury  Procedure(s): 1.  Cystoscopy 2. Right retrograde pyelogram with interpretation 3. Right ureteral stent placement 4. Dilation of urethra 5. Foley catheter placement  Surgeon: Jettie Pagan, MD  Assistants:  None  Anesthesia:  General  Complications:  None  EBL:  minimal  Specimens: 1.  ID Type Source Tests Collected by Time Destination  A : urine C/S from right renal pelvis Urine Urine, Cystoscope URINE CULTURE Jannifer Hick, MD 08/27/2020 2256     Drains/Catheters: 1.  6Fr x 26 cm right ureteral stent 2. 18 Fr foley catheter  Intraoperative findings:   1. Wide caliber bulbar urethral annular ring successfully dilated to 21 [place].  Mildly obstructing prostate.  No bladder lesions or bladder stones.  Ureteral orifices in normal orthotopic position bilaterally. 2. Severe right hydronephrosis due to mid right ureteral stone. 3. Right renal pelvis aspirate clear yellow. 4. Successful right ureteral stent placement with proximal coil within renal pelvis and distal coil within bladder  Indication:  Raymond Wallace is a 69 y.o. male with CT A/P 08/07/2020 with a 7 x 6 mm proximal mid right ureteral calculus causing moderate to high-grade obstruction. After reviewing the management options for treatment, he elected to proceed with the above surgical procedure(s). We have discussed the potential benefits and risks of the procedure, side effects of the proposed treatment, the likelihood of the patient achieving the goals of the procedure, and any potential problems that might occur during the procedure or recuperation. Informed consent has been obtained.  Description of procedure: The patient was taken to the operating room and general anesthesia was induced.  The patient was placed in the dorsal lithotomy position,  prepped and draped in the usual sterile fashion, and preoperative antibiotics were administered. A preoperative time-out was performed.   Cystourethroscopy was performed.  The patient's urethra was examined and identified a wide caliber bulbar urethral annular ring that was successfully dilated to 21 [place].  He had mildly obstructing prostate.  The bladder was then systematically examined in its entirety. There was no evidence for any bladder tumors, stones, or other mucosal pathology.    Attention then turned to the right ureteral orifice and a ureteral catheter was used to intubate the ureteral orifice after passing a sensor wire.  Right renal aspirate was obtained which was clear.  This was sent for right renal pelvis urine culture.  Omnipaque contrast was injected through the ureteral catheter and a retrograde pyelogram was performed with findings as dictated above.  A 0.38 sensor guidewire was then advanced up the right ureter into the renal pelvis under fluoroscopic guidance.  The wire was then backloaded through the cystoscope and a ureteral stent was advance over the wire using Seldinger technique.  The stent was positioned appropriately under fluoroscopic and cystoscopic guidance.  The wire was then removed with an adequate stent curl noted in the renal pelvis as well as in the bladder.  The bladder was then emptied.  18 French Foley catheter was placed given dilation of urethra. The procedure ended.  The patient appeared to tolerate the procedure well and without complications.  The patient was able to be awakened and transferred to the recovery unit in satisfactory condition.   Plan: Admit overnight for observation.  Plan to discharge home tomorrow.  Follow-up in office on Wednesday for Foley cath removal.  We  will arrange for definitive treatment of stone as outpatient.  [name] MD [facility]  Pager: [phone]

## 2020-08-27 NOTE — Consult Note (Deleted)
Urology Consult   Physician requesting consult: Alvira Monday, MD  Reason for consult: Right ureteral stone with AKI  History of Present Illness: Raymond Wallace is a 69 y.o. who presented the ED with right flank pain and constipation for the past 4 days.  He localizes the pain to his right lower abdomen states this is burning in nature.  It is worse with movement.  He denies fevers or chills.  He denies dysuria or hematuria.  No urinary frequency, urgency or changes in voiding.  He denies a history of urolithiasis.  CT A/P 08/07/2020 with a 7 x 6 mm proximal mid right ureteral calculus causing moderate to high-grade obstruction.  He denies a history of voiding or storage urinary symptoms, hematuria, UTIs, STDs, urolithiasis, GU malignancy/trauma/surgery.  Past Medical History:  Diagnosis Date  . Allergy   . Hypertension     Past Surgical History:  Procedure Laterality Date  . ENDOVENOUS ABLATION SAPHENOUS VEIN W/ LASER Right 08/05/2017   endovenous laser ablation right greater saphenous vein and stab phlebectomy 10-20 incisions right leg by Josephina Gip MD   . HERNIA REPAIR      Current Hospital Medications:  Home Meds:  No current facility-administered medications on file prior to encounter.   Current Outpatient Medications on File Prior to Encounter  Medication Sig Dispense Refill  . Coenzyme Q10 300 MG CAPS Take 300 mg by mouth daily.    . fluticasone (FLONASE) 50 MCG/ACT nasal spray Place 1-2 sprays into both nostrils daily as needed for allergies or rhinitis.    . Glucosamine HCl (GLUCOSAMINE PO) Take 2 tablets by mouth daily.    Marland Kitchen lisinopril (PRINIVIL,ZESTRIL) 5 MG tablet Take 5 mg by mouth daily.    . Multiple Vitamin (MULTIVITAMIN WITH MINERALS) TABS tablet Take 1 tablet by mouth daily.    . naproxen sodium (ALEVE) 220 MG tablet Take 220 mg by mouth daily.    . Probiotic Product (PROBIOTIC PO) Take 1 capsule by mouth daily.       Scheduled Meds: Continuous  Infusions: PRN Meds:.  Allergies: No Known Allergies  No family history on file.  Social History:  reports that he quit smoking about 33 years ago. He has never used smokeless tobacco. He reports that he does not drink alcohol and does not use drugs.  ROS: A complete review of systems was performed.  All systems are negative except for pertinent findings as noted.  Physical Exam:  Vital signs in last 24 hours: Temp:  [98.2 F (36.8 C)] 98.2 F (36.8 C) (11/21 1317) Pulse Rate:  [65-85] 84 (11/21 1915) Resp:  [13-20] 15 (11/21 1915) BP: (124-171)/(86-113) 144/87 (11/21 1915) SpO2:  [92 %-100 %] 92 % (11/21 1915) Constitutional:  Alert and oriented, No acute distress Cardiovascular: Regular rate and rhythm Respiratory: Normal respiratory effort, Lungs clear bilaterally GI: Abdomen is soft, nontender, nondistended, no abdominal masses GU: No CVA tenderness Neurologic: Grossly intact, no focal deficits Psychiatric: Normal mood and affect  Laboratory Data:  Recent Labs    08/27/20 1628  WBC 8.3  HGB 15.3  HCT 47.2  PLT 173    Recent Labs    08/27/20 1628  NA 138  K 4.3  CL 100  GLUCOSE 93  BUN 21  CALCIUM 9.1  CREATININE 1.91*     Results for orders placed or performed during the hospital encounter of 08/27/20 (from the past 24 hour(s))  Comprehensive metabolic panel     Status: Abnormal   Collection Time: 08/27/20  4:28 PM  Result Value Ref Range   Sodium 138 135 - 145 mmol/L   Potassium 4.3 3.5 - 5.1 mmol/L   Chloride 100 98 - 111 mmol/L   CO2 28 22 - 32 mmol/L   Glucose, Bld 93 70 - 99 mg/dL   BUN 21 8 - 23 mg/dL   Creatinine, Ser 6.37 (H) 0.61 - 1.24 mg/dL   Calcium 9.1 8.9 - 85.8 mg/dL   Total Protein 6.6 6.5 - 8.1 g/dL   Albumin 3.4 (L) 3.5 - 5.0 g/dL   AST 21 15 - 41 U/L   ALT 21 0 - 44 U/L   Alkaline Phosphatase 50 38 - 126 U/L   Total Bilirubin 1.2 0.3 - 1.2 mg/dL   GFR, Estimated 37 (L) >60 mL/min   Anion gap 10 5 - 15  Lipase, blood      Status: None   Collection Time: 08/27/20  4:28 PM  Result Value Ref Range   Lipase 30 11 - 51 U/L  CBC with Diff     Status: None   Collection Time: 08/27/20  4:28 PM  Result Value Ref Range   WBC 8.3 4.0 - 10.5 K/uL   RBC 4.80 4.22 - 5.81 MIL/uL   Hemoglobin 15.3 13.0 - 17.0 g/dL   HCT 85.0 39 - 52 %   MCV 98.3 80.0 - 100.0 fL   MCH 31.9 26.0 - 34.0 pg   MCHC 32.4 30.0 - 36.0 g/dL   RDW 27.7 41.2 - 87.8 %   Platelets 173 150 - 400 K/uL   nRBC 0.0 0.0 - 0.2 %   Neutrophils Relative % 64 %   Neutro Abs 5.3 1.7 - 7.7 K/uL   Lymphocytes Relative 21 %   Lymphs Abs 1.7 0.7 - 4.0 K/uL   Monocytes Relative 12 %   Monocytes Absolute 1.0 0.1 - 1.0 K/uL   Eosinophils Relative 2 %   Eosinophils Absolute 0.1 0.0 - 0.5 K/uL   Basophils Relative 1 %   Basophils Absolute 0.0 0.0 - 0.1 K/uL   Immature Granulocytes 0 %   Abs Immature Granulocytes 0.03 0.00 - 0.07 K/uL  Urinalysis, Routine w reflex microscopic Urine, Clean Catch     Status: Abnormal   Collection Time: 08/27/20  4:29 PM  Result Value Ref Range   Color, Urine YELLOW YELLOW   APPearance CLEAR CLEAR   Specific Gravity, Urine 1.012 1.005 - 1.030   pH 6.0 5.0 - 8.0   Glucose, UA NEGATIVE NEGATIVE mg/dL   Hgb urine dipstick NEGATIVE NEGATIVE   Bilirubin Urine NEGATIVE NEGATIVE   Ketones, ur 20 (A) NEGATIVE mg/dL   Protein, ur NEGATIVE NEGATIVE mg/dL   Nitrite NEGATIVE NEGATIVE   Leukocytes,Ua NEGATIVE NEGATIVE   No results found for this or any previous visit (from the past 240 hour(s)).  Renal Function: Recent Labs    08/27/20 1628  CREATININE 1.91*   CrCl cannot be calculated (Unknown ideal weight.).  Radiologic Imaging: CT ABDOMEN PELVIS W CONTRAST  Result Date: 08/27/2020 CLINICAL DATA:  Abdominal pain and constipation for 3 days. EXAM: CT ABDOMEN AND PELVIS WITH CONTRAST TECHNIQUE: Multidetector CT imaging of the abdomen and pelvis was performed using the standard protocol following bolus administration of  intravenous contrast. CONTRAST:  10mL OMNIPAQUE IOHEXOL 300 MG/ML  SOLN COMPARISON:  None. FINDINGS: Lower chest: Bibasilar subpleural atelectasis. No pleural effusions or worrisome pulmonary lesions. The heart is normal in size. No pericardial effusion. Small hiatal hernia is noted. Hepatobiliary: Small scattered hepatic  cysts. No worrisome hepatic lesions or intrahepatic biliary dilatation. The gallbladder appears normal. No common bile duct dilatation. Pancreas: No mass, inflammation or ductal dilatation. Spleen: Normal size. No focal lesions. Adrenals/Urinary Tract: The adrenal glands are unremarkable. Right-sided hydronephrosis and decreased/delayed perfusion of the right kidney consistent with obstruction. The cause is a 7 x 6 mm calculus in the proximal mid ureter located at the L3-4 disc space level. Right renal calculi are also noted. Cortical defects involving the right kidney likely areas of previous infection. No left-sided renal or ureteral calculi. Defect involving the lower pole region likely related to prior infection. The bladder is unremarkable. No bladder mass or calculi. Stomach/Bowel: The stomach, duodenum, small bowel and colon unremarkable. No acute inflammatory changes, mass lesions or obstructive findings. The terminal ileum is normal. The appendix is normal. Scattered descending colon and sigmoid colon diverticulosis but no findings for acute diverticulitis. Vascular/Lymphatic: Moderate tortuosity and calcification of the abdominal aorta. No aneurysm or dissection. The branch vessels are patent. No mesenteric or retroperitoneal mass or adenopathy. Reproductive: The prostate gland and seminal vesicles are unremarkable. Other: No pelvic mass or adenopathy. No free pelvic fluid collections. No inguinal mass or adenopathy. No abdominal wall hernia or subcutaneous lesions. Musculoskeletal: Remote L2 compression fracture with vertebral plana appearance and moderate retropulsion and moderate canal  compromise. No acute bony findings. No worrisome bone lesions. IMPRESSION: 1. 7 x 6 mm proximal mid right ureteral calculus (L3-4 level) causing moderate to high-grade obstruction. 2. Right renal calculi. 3. No other significant abdominal/pelvic findings, mass lesions or adenopathy. 4. Remote L2 compression fracture with vertebral plana appearance and moderate retropulsion and moderate canal compromise. 5. Small hiatal hernia. 6. Aortic atherosclerosis. Aortic Atherosclerosis (ICD10-I70.0). Electronically Signed   By: Rudie Meyer M.D.   On: 08/27/2020 18:17    I independently reviewed the above imaging studies.  Impression/Recommendation 1. Right ureteral stone causing AKI: CT A/P 08/07/2020 with a 7 x 6 mm proximal mid right ureteral calculus causing moderate to high-grade obstruction. Afebrile, no leukocytosis, creatinine 1.9 from baseline 1.1-1.2, urinalysis negative for infection. 2.  AKI: Creatinine 1.9 from baseline 1.1-1.2  -The risks, benefits and alternatives of cystoscopy with right JJ stent placement was discussed with the patient.  Risks include, but are not limited to: bleeding, urinary tract infection, ureteral injury, ureteral stricture disease, chronic pain, urinary symptoms, bladder injury, stent migration, the need for nephrostomy tube placement, MI, CVA, DVT, PE and the inherent risks with general anesthesia.  The patient voices understanding and wishes to proceed.  Matt R. Teara Duerksen MD [facility]  Pager: [phone]   CC: Alvira Monday, MD

## 2020-08-27 NOTE — ED Triage Notes (Signed)
Pt states he has been having constipation with no bowel movements on Thursday or Friday and patient used enemas on Saturday x 1 and 1 this morning. PT states he had little feces with either enema and was slight amount and hard. Pt now complains of abdominal pain and still has not had a significant or normal bowel movement. Pt is aox4 and ambulatory.

## 2020-08-28 ENCOUNTER — Encounter (HOSPITAL_COMMUNITY): Payer: Self-pay | Admitting: Urology

## 2020-08-28 LAB — CBC
HCT: 44.4 % (ref 39.0–52.0)
Hemoglobin: 14.6 g/dL (ref 13.0–17.0)
MCH: 31.3 pg (ref 26.0–34.0)
MCHC: 32.9 g/dL (ref 30.0–36.0)
MCV: 95.3 fL (ref 80.0–100.0)
Platelets: 170 10*3/uL (ref 150–400)
RBC: 4.66 MIL/uL (ref 4.22–5.81)
RDW: 12.2 % (ref 11.5–15.5)
WBC: 7.9 10*3/uL (ref 4.0–10.5)
nRBC: 0 % (ref 0.0–0.2)

## 2020-08-28 LAB — BASIC METABOLIC PANEL
Anion gap: 13 (ref 5–15)
BUN: 21 mg/dL (ref 8–23)
CO2: 25 mmol/L (ref 22–32)
Calcium: 8.8 mg/dL — ABNORMAL LOW (ref 8.9–10.3)
Chloride: 100 mmol/L (ref 98–111)
Creatinine, Ser: 1.55 mg/dL — ABNORMAL HIGH (ref 0.61–1.24)
GFR, Estimated: 48 mL/min — ABNORMAL LOW (ref >60)
Glucose, Bld: 120 mg/dL — ABNORMAL HIGH (ref 70–99)
Potassium: 4.6 mmol/L (ref 3.5–5.1)
Sodium: 138 mmol/L (ref 135–145)

## 2020-08-28 LAB — URINE CULTURE: Culture: NO GROWTH

## 2020-08-28 LAB — HIV ANTIBODY (ROUTINE TESTING W REFLEX): HIV Screen 4th Generation wRfx: NONREACTIVE

## 2020-08-28 MED ORDER — CHLORHEXIDINE GLUCONATE CLOTH 2 % EX PADS: 6.0000 | MEDICATED_PAD | Freq: Every day | CUTANEOUS | Status: DC

## 2020-08-28 NOTE — Progress Notes (Signed)
1 Day Post-Op Subjective: Pain controlled. Complains some burning sensation in his penis and some discomfort with Foley catheter. No nausea or emesis. Afebrile.  Objective: Vital signs in last 24 hours: Temp:  [97.4 F (36.3 C)-98.5 F (36.9 C)] 98.3 F (36.8 C) (11/22 0456) Pulse Rate:  [65-88] 85 (11/22 0456) Resp:  [12-20] 17 (11/22 0456) BP: (96-171)/(65-113) 100/66 (11/22 0456) SpO2:  [91 %-100 %] 95 % (11/22 0456)  Intake/Output from previous day: 11/21 0701 - 11/22 0700 In: 1597.6 [P.O.:120; I.V.:927.6; IV Piggyback:550] Out: 655 [Urine:650; Blood:5] Intake/Output this shift: No intake/output data recorded.  Physical Exam:  General: Alert and oriented CV: RRR Lungs: Clear Abdomen: Soft, nondistended, nontender Ext: NT, No erythema Foley draining clear yellow urine  Lab Results: Recent Labs    08/27/20 1628 08/28/20 0240  HGB 15.3 14.6  HCT 47.2 44.4   BMET Recent Labs    08/27/20 1628 08/28/20 0240  NA 138 138  K 4.3 4.6  CL 100 100  CO2 28 25  GLUCOSE 93 120*  BUN 21 21  CREATININE 1.91* 1.55*  CALCIUM 9.1 8.8*     Studies/Results: CT ABDOMEN PELVIS W CONTRAST  Result Date: 08/27/2020 CLINICAL DATA:  Abdominal pain and constipation for 3 days. EXAM: CT ABDOMEN AND PELVIS WITH CONTRAST TECHNIQUE: Multidetector CT imaging of the abdomen and pelvis was performed using the standard protocol following bolus administration of intravenous contrast. CONTRAST:  56mL OMNIPAQUE IOHEXOL 300 MG/ML  SOLN COMPARISON:  None. FINDINGS: Lower chest: Bibasilar subpleural atelectasis. No pleural effusions or worrisome pulmonary lesions. The heart is normal in size. No pericardial effusion. Small hiatal hernia is noted. Hepatobiliary: Small scattered hepatic cysts. No worrisome hepatic lesions or intrahepatic biliary dilatation. The gallbladder appears normal. No common bile duct dilatation. Pancreas: No mass, inflammation or ductal dilatation. Spleen: Normal size. No  focal lesions. Adrenals/Urinary Tract: The adrenal glands are unremarkable. Right-sided hydronephrosis and decreased/delayed perfusion of the right kidney consistent with obstruction. The cause is a 7 x 6 mm calculus in the proximal mid ureter located at the L3-4 disc space level. Right renal calculi are also noted. Cortical defects involving the right kidney likely areas of previous infection. No left-sided renal or ureteral calculi. Defect involving the lower pole region likely related to prior infection. The bladder is unremarkable. No bladder mass or calculi. Stomach/Bowel: The stomach, duodenum, small bowel and colon unremarkable. No acute inflammatory changes, mass lesions or obstructive findings. The terminal ileum is normal. The appendix is normal. Scattered descending colon and sigmoid colon diverticulosis but no findings for acute diverticulitis. Vascular/Lymphatic: Moderate tortuosity and calcification of the abdominal aorta. No aneurysm or dissection. The branch vessels are patent. No mesenteric or retroperitoneal mass or adenopathy. Reproductive: The prostate gland and seminal vesicles are unremarkable. Other: No pelvic mass or adenopathy. No free pelvic fluid collections. No inguinal mass or adenopathy. No abdominal wall hernia or subcutaneous lesions. Musculoskeletal: Remote L2 compression fracture with vertebral plana appearance and moderate retropulsion and moderate canal compromise. No acute bony findings. No worrisome bone lesions. IMPRESSION: 1. 7 x 6 mm proximal mid right ureteral calculus (L3-4 level) causing moderate to high-grade obstruction. 2. Right renal calculi. 3. No other significant abdominal/pelvic findings, mass lesions or adenopathy. 4. Remote L2 compression fracture with vertebral plana appearance and moderate retropulsion and moderate canal compromise. 5. Small hiatal hernia. 6. Aortic atherosclerosis. Aortic Atherosclerosis (ICD10-I70.0). Electronically Signed   By: Rudie Meyer  M.D.   On: 08/27/2020 18:17    Assessment/Plan: 1. Right  ureteral stone causing AKI: CT A/P 08/07/2020 with a 7 x 6 mm proximal mid right ureteral calculus causing moderate to high-grade obstruction. Afebrile, no leukocytosis, creatinine 1.9 from baseline 1.1-1.2, urinalysis negative for infection. S/p cystoscopy, urethral dilation, right retrograde pyelogram, right ureteral stent placement 08/27/2020. 2.  AKI: Creatinine 1.9 from baseline 1.1-1.2  -Pain control as needed -Diet as tolerated -Follow-up urine culture -Creatinine downtrending to 1.55 -Leave Foley catheter in place given dilation of urethral stricture. -Appropriate for discharge home today with Foley catheter and stent in place. He will follow up on Wednesday for void trial. We will arrange for definitive treatment of stone as outpatient. Messaged schedulers to arrange.   LOS: 0 days   Matt R. Matika Bartell MD 08/28/2020, 7:06 AM [facility]  Pager: [phone]

## 2020-08-28 NOTE — Progress Notes (Signed)
Patient stable for discharge. Patient will be discharging to home with foley catheter intact. Follow up appointment scheduled with urologist for removal on Wednesday Nov. 24th. Discharge instructions given as well as teaching on how to properly care for foley catheter at home. Patient demonstrated care and stated that he felt comfortable with cathter care at home. No further concerns or complaints.

## 2020-08-28 NOTE — Discharge Summary (Signed)
Date of admission: 08/27/2020  Date of discharge: 08/28/2020  Admission diagnosis: Right ureteral stone  Discharge diagnosis: Right ureteral stone  Secondary diagnoses: AKI  History and Physical: For full details, please see admission history and physical. Briefly, Raymond Wallace is a 70 y.o. year old patient with an obstructing right ureteral stone.   Hospital Course: He underwent cysto, right ureteral stent placement. The patient recovered in the usual expected fashion.  He had his diet advanced slowly.  Initially managed with IV pain control, then transitioned to PO meds when he was tolerating oral intake.  His labs were stable throughout the hospital course.  He was discharged to home on POD#1.  At the time of discharge the patient was tolerating a regular diet, passing flatus, ambulating, had adequate pain control and was agreeable to discharge.  Follow up as scheduled.    Laboratory values:  Recent Labs    08/27/20 1628 08/28/20 0240  HGB 15.3 14.6  HCT 47.2 44.4   Recent Labs    08/27/20 1628 08/28/20 0240  CREATININE 1.91* 1.55*    Disposition: Home  Discharge instruction: The patient was instructed to be ambulatory but told to refrain from heavy lifting, strenuous activity, or driving.  Discharge medications:  Allergies as of 08/28/2020   No Known Allergies     Medication List    TAKE these medications   cephALEXin 500 MG capsule Commonly known as: KEFLEX Take 1 capsule (500 mg total) by mouth 2 (two) times daily for 6 doses.   Coenzyme Q10 300 MG Caps Take 300 mg by mouth daily.   docusate sodium 100 MG capsule Commonly known as: Colace Take 1 capsule (100 mg total) by mouth daily as needed for up to 30 doses.   fluticasone 50 MCG/ACT nasal spray Commonly known as: FLONASE Place 1-2 sprays into both nostrils daily as needed for allergies or rhinitis.   GLUCOSAMINE PO Take 2 tablets by mouth daily.   lisinopril 5 MG tablet Commonly known as:  ZESTRIL Take 5 mg by mouth daily.   multivitamin with minerals Tabs tablet Take 1 tablet by mouth daily.   naproxen sodium 220 MG tablet Commonly known as: ALEVE Take 220 mg by mouth daily.   oxyCODONE-acetaminophen 5-325 MG tablet Commonly known as: Percocet Take 1 tablet by mouth every 4 (four) hours as needed for up to 12 doses for severe pain.   PROBIOTIC PO Take 1 capsule by mouth daily.       Followup:   Follow-up Information    Schedule an appointment as soon as possible for a visit with [facility].   Why: Call for appt. I messaged schedulers to call you. Contact information: [address] [place] [address] [phone]              Matt R. Adrijana Haros MD [facility]  Pager: [phone]

## 2020-08-28 NOTE — Progress Notes (Signed)
Pt arrived to unit from PACU. Pt is alert and oriented x 4. Pt with foley catheter in place. Oriented pt to plan of care. Will monitor.

## 2020-08-28 NOTE — Anesthesia Postprocedure Evaluation (Signed)
Anesthesia Post Note  Patient: Egbert Seidel Bastien  Procedure(s) Performed: CYSTOSCOPY WITH RETROGRADE PYELOGRAM/URETERAL STENT PLACEMENT (Right )     Patient location during evaluation: PACU Anesthesia Type: General Level of consciousness: awake and alert Pain management: pain level controlled Vital Signs Assessment: post-procedure vital signs reviewed and stable Respiratory status: spontaneous breathing, nonlabored ventilation and respiratory function stable Cardiovascular status: blood pressure returned to baseline and stable Postop Assessment: no apparent nausea or vomiting Anesthetic complications: no   No complications documented.  Last Vitals:  Vitals:   08/28/20 0209 08/28/20 0456  BP: 96/65 100/66  Pulse: 71 85  Resp: 20 17  Temp: 36.9 C 36.8 C  SpO2: 94% 95%    Last Pain:  Vitals:   08/28/20 0456  TempSrc: Oral  PainSc:                  Kathaleya Mcduffee,W. EDMOND

## 2020-08-29 LAB — URINE CULTURE: Culture: NO GROWTH

## 2020-09-06 ENCOUNTER — Other Ambulatory Visit: Payer: Self-pay | Admitting: Urology

## 2020-10-04 ENCOUNTER — Encounter (HOSPITAL_BASED_OUTPATIENT_CLINIC_OR_DEPARTMENT_OTHER): Payer: Self-pay | Admitting: Urology

## 2020-10-04 ENCOUNTER — Other Ambulatory Visit: Payer: Self-pay

## 2020-10-04 NOTE — Progress Notes (Signed)
Spoke w/ via phone for pre-op interview--- PT Lab needs dos----  Istat and EKG             Lab results------ no COVID test ------ 10-05-2020 @ 0850 Arrive at ------- 1230 NPO after MN NO Solid Food.  Clear liquids from MN until--- 1130 Medications to take morning of surgery ----- NONE Diabetic medication ----- n/a Patient Special Instructions ----- n/a Pre-Op special Istructions ----- n/a Patient verbalized understanding of instructions that were given at this phone interview. Patient denies shortness of breath, chest pain, fever, cough at this phone interview.

## 2020-10-05 ENCOUNTER — Other Ambulatory Visit (HOSPITAL_COMMUNITY)
Admission: RE | Admit: 2020-10-05 | Discharge: 2020-10-05 | Disposition: A | Discharge status: Home / Self Care | Payer: 59 | Source: Ambulatory Visit | Attending: Urology | Admitting: Urology

## 2020-10-05 DIAGNOSIS — Z01812 Encounter for preprocedural laboratory examination: Secondary | ICD-10-CM | POA: Diagnosis not present

## 2020-10-05 DIAGNOSIS — Z20822 Contact with and (suspected) exposure to covid-19: Secondary | ICD-10-CM | POA: Insufficient documentation

## 2020-10-05 LAB — SARS CORONAVIRUS 2 (TAT 6-24 HRS): SARS Coronavirus 2: NEGATIVE

## 2020-10-09 ENCOUNTER — Encounter (HOSPITAL_BASED_OUTPATIENT_CLINIC_OR_DEPARTMENT_OTHER)
Admission: RE | Disposition: A | Discharge status: Home / Self Care | Payer: Self-pay | Source: Home / Self Care | Attending: Urology

## 2020-10-09 ENCOUNTER — Encounter (HOSPITAL_BASED_OUTPATIENT_CLINIC_OR_DEPARTMENT_OTHER): Payer: Self-pay | Admitting: Urology

## 2020-10-09 ENCOUNTER — Ambulatory Visit (HOSPITAL_BASED_OUTPATIENT_CLINIC_OR_DEPARTMENT_OTHER): Payer: 59 | Admitting: Anesthesiology

## 2020-10-09 ENCOUNTER — Ambulatory Visit (HOSPITAL_BASED_OUTPATIENT_CLINIC_OR_DEPARTMENT_OTHER)
Admission: RE | Admit: 2020-10-09 | Discharge: 2020-10-09 | Disposition: A | Discharge status: Home / Self Care | Payer: 59 | Attending: Urology | Admitting: Urology

## 2020-10-09 DIAGNOSIS — N201 Calculus of ureter: Secondary | ICD-10-CM | POA: Diagnosis present

## 2020-10-09 DIAGNOSIS — N202 Calculus of kidney with calculus of ureter: Secondary | ICD-10-CM | POA: Diagnosis not present

## 2020-10-09 HISTORY — DX: Calculus of ureter: N20.1

## 2020-10-09 HISTORY — PX: CYSTOSCOPY/URETEROSCOPY/HOLMIUM LASER/STENT PLACEMENT: SHX6546

## 2020-10-09 HISTORY — DX: Gastro-esophageal reflux disease without esophagitis: K21.9

## 2020-10-09 HISTORY — DX: Personal history of other (healed) physical injury and trauma: Z87.828

## 2020-10-09 HISTORY — DX: Personal history of other specified conditions: Z87.898

## 2020-10-09 HISTORY — DX: Presence of dental prosthetic device (complete) (partial): Z97.2

## 2020-10-09 HISTORY — DX: Unspecified osteoarthritis, unspecified site: M19.90

## 2020-10-09 HISTORY — DX: Nocturia: R35.1

## 2020-10-09 HISTORY — DX: Presence of spectacles and contact lenses: Z97.3

## 2020-10-09 HISTORY — DX: Personal history of urinary calculi: Z87.442

## 2020-10-09 HISTORY — DX: Calculus of kidney: N20.0

## 2020-10-09 LAB — POCT I-STAT, CHEM 8
BUN: 21 mg/dL (ref 8–23)
BUN: 28 mg/dL — ABNORMAL HIGH (ref 8–23)
Calcium, Ion: 1.29 mmol/L (ref 1.15–1.40)
Calcium, Ion: 1.19 mmol/L (ref 1.15–1.40)
Chloride: 99 mmol/L (ref 98–111)
Chloride: 102 mmol/L (ref 98–111)
Creatinine, Ser: 1.3 mg/dL — ABNORMAL HIGH (ref 0.61–1.24)
Creatinine, Ser: 1.1 mg/dL (ref 0.61–1.24)
Glucose, Bld: 88 mg/dL (ref 70–99)
Glucose, Bld: 83 mg/dL (ref 70–99)
HCT: 45 % (ref 39.0–52.0)
HCT: 46 % (ref 39.0–52.0)
Hemoglobin: 15.3 g/dL (ref 13.0–17.0)
Hemoglobin: 15.6 g/dL (ref 13.0–17.0)
Potassium: 4.3 mmol/L (ref 3.5–5.1)
Potassium: 6.5 mmol/L (ref 3.5–5.1)
Sodium: 141 mmol/L (ref 135–145)
Sodium: 138 mmol/L (ref 135–145)
TCO2: 31 mmol/L (ref 22–32)
TCO2: 31 mmol/L (ref 22–32)

## 2020-10-09 SURGERY — CYSTOSCOPY/URETEROSCOPY/HOLMIUM LASER/STENT PLACEMENT
Anesthesia: General | Laterality: Right

## 2020-10-09 MED ORDER — FENTANYL CITRATE (PF) 100 MCG/2ML IJ SOLN
INTRAMUSCULAR | Status: AC
  Filled 2020-10-09: qty 2

## 2020-10-09 MED ORDER — OXYCODONE-ACETAMINOPHEN 5-325 MG PO TABS: 1.0000 | ORAL_TABLET | ORAL | 0 refills | Status: DC | PRN

## 2020-10-09 MED ORDER — CEFAZOLIN SODIUM-DEXTROSE 2-4 GM/100ML-% IV SOLN
2.0000 g | Freq: Once | INTRAVENOUS | Status: AC
  Administered 2020-10-09: 2 g via INTRAVENOUS

## 2020-10-09 MED ORDER — KETOROLAC TROMETHAMINE 30 MG/ML IJ SOLN
INTRAMUSCULAR | Status: AC
  Filled 2020-10-09: qty 1

## 2020-10-09 MED ORDER — OXYCODONE-ACETAMINOPHEN 5-325 MG PO TABS: 1.0000 | ORAL_TABLET | ORAL | 0 refills | Status: AC | PRN

## 2020-10-09 MED ORDER — PROPOFOL 10 MG/ML IV BOLUS
INTRAVENOUS | Status: AC
  Filled 2020-10-09: qty 20

## 2020-10-09 MED ORDER — ONDANSETRON HCL 4 MG/2ML IJ SOLN
INTRAMUSCULAR | Status: DC | PRN
  Administered 2020-10-09: 4 mg via INTRAVENOUS

## 2020-10-09 MED ORDER — LIDOCAINE 2% (20 MG/ML) 5 ML SYRINGE
INTRAMUSCULAR | Status: DC | PRN
  Administered 2020-10-09: 100 mg via INTRAVENOUS

## 2020-10-09 MED ORDER — MEPERIDINE HCL 25 MG/ML IJ SOLN: 6.2500 mg | INTRAMUSCULAR | Status: DC | PRN

## 2020-10-09 MED ORDER — OXYCODONE HCL 5 MG/5ML PO SOLN
5.0000 mg | Freq: Once | ORAL | Status: DC | PRN
Start: 2020-10-09 — End: 2020-10-09

## 2020-10-09 MED ORDER — SODIUM CHLORIDE 0.9 % IR SOLN
Status: DC | PRN
  Administered 2020-10-09 (×2): 3000 mL via INTRAVESICAL

## 2020-10-09 MED ORDER — CEFAZOLIN SODIUM-DEXTROSE 2-4 GM/100ML-% IV SOLN
INTRAVENOUS | Status: AC
  Filled 2020-10-09: qty 100

## 2020-10-09 MED ORDER — LIDOCAINE HCL (PF) 2 % IJ SOLN
INTRAMUSCULAR | Status: AC
  Filled 2020-10-09: qty 5

## 2020-10-09 MED ORDER — DOCUSATE SODIUM 100 MG PO CAPS: 100.0000 mg | ORAL_CAPSULE | Freq: Every day | ORAL | 2 refills | Status: AC | PRN

## 2020-10-09 MED ORDER — FENTANYL CITRATE (PF) 100 MCG/2ML IJ SOLN
25.0000 ug | INTRAMUSCULAR | Status: DC | PRN
Start: 2020-10-09 — End: 2020-10-09

## 2020-10-09 MED ORDER — ACETAMINOPHEN 160 MG/5ML PO SOLN: 325.0000 mg | ORAL | Status: DC | PRN

## 2020-10-09 MED ORDER — DEXAMETHASONE SODIUM PHOSPHATE 10 MG/ML IJ SOLN
INTRAMUSCULAR | Status: AC
  Filled 2020-10-09: qty 1

## 2020-10-09 MED ORDER — KETOROLAC TROMETHAMINE 30 MG/ML IJ SOLN
INTRAMUSCULAR | Status: DC | PRN
  Administered 2020-10-09: 15 mg via INTRAVENOUS

## 2020-10-09 MED ORDER — DEXAMETHASONE SODIUM PHOSPHATE 10 MG/ML IJ SOLN
INTRAMUSCULAR | Status: DC | PRN
  Administered 2020-10-09: 10 mg via INTRAVENOUS

## 2020-10-09 MED ORDER — OXYCODONE HCL 5 MG PO TABS: 5.0000 mg | ORAL_TABLET | Freq: Once | ORAL | Status: DC | PRN

## 2020-10-09 MED ORDER — ACETAMINOPHEN 325 MG PO TABS: 325.0000 mg | ORAL_TABLET | ORAL | Status: DC | PRN

## 2020-10-09 MED ORDER — ONDANSETRON HCL 4 MG/2ML IJ SOLN
INTRAMUSCULAR | Status: AC
  Filled 2020-10-09: qty 2

## 2020-10-09 MED ORDER — IOHEXOL 300 MG/ML  SOLN
INTRAMUSCULAR | Status: DC | PRN
  Administered 2020-10-09: 4 mL via URETHRAL

## 2020-10-09 MED ORDER — MIDAZOLAM HCL 2 MG/2ML IJ SOLN
INTRAMUSCULAR | Status: AC
  Filled 2020-10-09: qty 2

## 2020-10-09 MED ORDER — MIDAZOLAM HCL 5 MG/5ML IJ SOLN
INTRAMUSCULAR | Status: DC | PRN
  Administered 2020-10-09: 2 mg via INTRAVENOUS

## 2020-10-09 MED ORDER — CEPHALEXIN 500 MG PO CAPS: 500.0000 mg | ORAL_CAPSULE | Freq: Four times a day (QID) | ORAL | 0 refills | Status: AC

## 2020-10-09 MED ORDER — ACETAMINOPHEN 10 MG/ML IV SOLN: 1000.0000 mg | Freq: Once | INTRAVENOUS | Status: DC | PRN

## 2020-10-09 MED ORDER — FENTANYL CITRATE (PF) 100 MCG/2ML IJ SOLN
INTRAMUSCULAR | Status: DC | PRN
  Administered 2020-10-09 (×2): 50 ug via INTRAVENOUS

## 2020-10-09 MED ORDER — PROPOFOL 10 MG/ML IV BOLUS
INTRAVENOUS | Status: DC | PRN
  Administered 2020-10-09: 200 mg via INTRAVENOUS

## 2020-10-09 MED ORDER — ONDANSETRON HCL 4 MG/2ML IJ SOLN: 4.0000 mg | Freq: Once | INTRAMUSCULAR | Status: DC | PRN

## 2020-10-09 MED ORDER — LACTATED RINGERS IV SOLN: INTRAVENOUS | Status: DC

## 2020-10-09 SURGICAL SUPPLY — 28 items
BAG DRAIN URO-CYSTO SKYTR STRL (DRAIN) ×2 IMPLANT
BAG DRN UROCATH (DRAIN) ×1
BASKET ZERO TIP NITINOL 2.4FR (BASKET) ×2 IMPLANT
BSKT STON RTRVL ZERO TP 2.4FR (BASKET) ×1
CATH INTERMIT  6FR 70CM (CATHETERS) IMPLANT
CATH SET URETHRAL DILATOR (CATHETERS) IMPLANT
CATH URET 5FR 28IN OPEN ENDED (CATHETERS) ×2 IMPLANT
CLOTH BEACON ORANGE TIMEOUT ST (SAFETY) ×2 IMPLANT
FIBER LASER FLEXIVA 365 (UROLOGICAL SUPPLIES) IMPLANT
GLOVE BIO SURGEON STRL SZ7.5 (GLOVE) ×2 IMPLANT
GOWN STRL REUS W/TWL LRG LVL3 (GOWN DISPOSABLE) ×2 IMPLANT
GUIDEWIRE STR DUAL SENSOR (WIRE) ×4 IMPLANT
GUIDEWIRE ZIPWRE .038 STRAIGHT (WIRE) ×2 IMPLANT
IV NS 1000ML (IV SOLUTION) ×2
IV NS 1000ML BAXH (IV SOLUTION) ×1 IMPLANT
IV NS IRRIG 3000ML ARTHROMATIC (IV SOLUTION) ×2 IMPLANT
KIT TURNOVER CYSTO (KITS) ×2 IMPLANT
MANIFOLD NEPTUNE II (INSTRUMENTS) ×2 IMPLANT
NS IRRIG 500ML POUR BTL (IV SOLUTION) ×2 IMPLANT
PACK CYSTO (CUSTOM PROCEDURE TRAY) ×2 IMPLANT
SHEATH URET ACCESS 12FR/35CM (UROLOGICAL SUPPLIES) ×2 IMPLANT
STENT CONTOUR NO GW 8FR 26CM (STENTS) ×2 IMPLANT
SYR 10ML LL (SYRINGE) ×4 IMPLANT
TRACTIP FLEXIVA PULS ID 200XHI (Laser) ×1 IMPLANT
TRACTIP FLEXIVA PULSE ID 200 (Laser) ×2
TUBE CONNECTING 12X1/4 (SUCTIONS) ×2 IMPLANT
TUBE FEEDING 8FR 16IN STR KANG (MISCELLANEOUS) ×2 IMPLANT
TUBING UROLOGY SET (TUBING) ×2 IMPLANT

## 2020-10-09 NOTE — Anesthesia Preprocedure Evaluation (Signed)
Anesthesia Evaluation  Patient identified by MRN, date of birth, ID band Patient awake    Reviewed: Allergy & Precautions, H&P , NPO status , Patient's Chart, lab work & pertinent test results  Airway Mallampati: II  TM Distance: >3 FB Neck ROM: Full    Dental no notable dental hx. (+) Teeth Intact, Dental Advisory Given   Pulmonary former smoker,    Pulmonary exam normal breath sounds clear to auscultation       Cardiovascular hypertension, Pt. on medications Normal cardiovascular exam Rhythm:Regular Rate:Normal     Neuro/Psych  Headaches, negative psych ROS   GI/Hepatic Neg liver ROS,   Endo/Other  negative endocrine ROS  Renal/GU   negative genitourinary   Musculoskeletal  (+) Arthritis , Osteoarthritis,    Abdominal Normal abdominal exam  (+)   Peds  Hematology negative hematology ROS (+)   Anesthesia Other Findings   Reproductive/Obstetrics negative OB ROS                             Anesthesia Physical  Anesthesia Plan  ASA: II  Anesthesia Plan: General   Post-op Pain Management:    Induction: Intravenous  PONV Risk Score and Plan: 3 and Ondansetron, Dexamethasone, Treatment may vary due to age or medical condition and Midazolam  Airway Management Planned: LMA  Additional Equipment: None  Intra-op Plan:   Post-operative Plan: Extubation in OR  Informed Consent: I have reviewed the patients History and Physical, chart, labs and discussed the procedure including the risks, benefits and alternatives for the proposed anesthesia with the patient or authorized representative who has indicated his/her understanding and acceptance.     Dental advisory given  Plan Discussed with: CRNA  Anesthesia Plan Comments:         Anesthesia Quick Evaluation

## 2020-10-09 NOTE — Anesthesia Procedure Notes (Signed)
Procedure Name: LMA Insertion Date/Time: 10/09/2020 3:17 PM Performed by: Briant Sites, CRNA Pre-anesthesia Checklist: Patient identified, Emergency Drugs available, Suction available and Patient being monitored Patient Re-evaluated:Patient Re-evaluated prior to induction Oxygen Delivery Method: Circle system utilized Preoxygenation: Pre-oxygenation with 100% oxygen Induction Type: IV induction Ventilation: Mask ventilation without difficulty LMA: LMA inserted LMA Size: 5.0 Number of attempts: 1 Airway Equipment and Method: Bite block Placement Confirmation: positive ETCO2 Tube secured with: Tape Dental Injury: Teeth and Oropharynx as per pre-operative assessment

## 2020-10-09 NOTE — Op Note (Signed)
Operative Note  Preoperative diagnosis:  1.  Right ureteral stone  Postoperative diagnosis: 1.  Right renal stones  Procedure(s): 1.  Cystoscopy 2.  Right ureteral stent exchange 3.  Right ureteroscopy with laser lithotripsy and basket traction of stones 4.  Right retrograde pyelogram 5.  Fluoroscopy with intraoperative interpretation  Surgeon: Jettie Pagan, MD  Assistants:  None  Anesthesia:  General  Complications:  None  EBL:  minimal  Specimens: 1. Stones for stone analysis  Drains/Catheters: 1.  6 French by 26 cm right ureteral stent  Intraoperative findings:   1. 3 separate right renal stone successfully fragmented and passed extracted 2. No right hydronephrosis 3. No significant bleeding 4. Minor contusion in right proximal ureter 5. Cystoscopy right ureteral stent placement without a string  Indication:  Raymond Wallace is a 70 y.o. male with a right ureteral stone seen on CT A/P 08/27/2020 with additional right nonobstructing stones.  He presents today for definitive treatment of his right-sided stones.  Description of procedure: After informed consent was obtained from the patient, the patient was identified and taken to the operating room and placed in the supine position.  General anesthesia was administered as well as perioperative IV antibiotics.  At the beginning of the case, a time-out was performed to properly identify the patient, the surgery to be performed, and the surgical site.  Sequential compression devices were applied to the lower extremities at the beginning of the case for DVT prophylaxis.  The patient was then placed in the dorsal lithotomy supine position, prepped and draped in sterile fashion.  Preliminary scout fluoroscopy revealed that there was a calcification area at the right renal pelvis, which corresponds to the stone found on the preoperative CT scan. We then passed the 21-French rigid cystoscope through the urethra and into the  bladder under vision without any difficulty, noting a normal urethra without strictures and a mildly obstructing prostate.  A systematic evaluation of the bladder revealed no evidence of any suspicious bladder lesions.  Ureteral orifices were in normal position.    The distal aspect of the ureteral stent was seen protruding from the right ureteral orifice.  We then used the alligator-tooth forceps and grasped the distal end of the ureteral stent and brought it out the urethral meatus while watching the proximal coil straighten out nicely on fluoroscopy. Through the ureteral stent, we then passed a 0.038 sensor wire up to the level of the renal pelvis.  The ureteral stent was then removed, leaving the sensor wire up the right ureter.    A semi-rigid ureteroscope was passed alongside the wire up the distal ureter which appeared normal. A second 0.038 sensor wire was passed under direct vision and the semirigid scope was removed. A 12/14Fr ureteral access sheath was carefully advanced up the ureter to the level of the UPJ over this wire under fluoroscopic guidance. The flexible ureteroscope was advanced into the collecting system via the access sheath. The collecting system was inspected.  A 7 x 6 mm stone was was identified at the right pole calyx. 2 additional 4 mm stones were identified within the right interpolar calyces. Using the 200 micron holmium laser fiber, the stone was fragmented completely. A 2.2 Fr zero tip basket was used to remove the fragments under visual guidance. These were sent for chemical analysis. With the ureteroscope in the kidney, a gentle pyelogram was performed to delineate the calyceal system and we evaluated the calyces systematically. We encountered no further stone burden. The  rest of the stone fragments were very tiny and these were  irrigated away gently. The calyces were re-inspected and there were no significant stone fragment residual.   We then withdrew the ureteroscope back  down the ureter along with the access sheath, noting no evidence of any stones along the course of the ureter. There was a slight contusion identified in the right proximal ureter.  Once the ureteroscope was removed, the Glidewire was backloaded through the rigid cystoscope, which was then advanced down the urethra and into the bladder. We then used the Glidewire under direct vision through the rigid cystoscope and under fluoroscopic guidance and passed up a 6-French, 26 cm double-pigtail ureteral stent up ureter, making sure that the proximal and distal ends coiled within the kidney and bladder respectively.  No tether string was left.   The cystoscope was then advanced back into the bladder under vision.  We were able to see the distal stent coiling nicely within the bladder.  The bladder was then emptied with irrigation solution.  The cystoscope was then removed.    The patient tolerated the procedure well and there was no complication. Patient was awoken from anesthesia and taken to the recovery room in stable condition. I was present and scrubbed for the entirety of the case.  Plan:  Patient will be discharged home.  Follow up with me in 10-14 days for right stent removal in the office.  Matt R. Lailanie Hasley MD [facility]  Pager: [phone]

## 2020-10-09 NOTE — Anesthesia Postprocedure Evaluation (Signed)
Anesthesia Post Note  Patient: Raymond Wallace  Procedure(s) Performed: CYSTOSCOPY/URETEROSCOPY/HOLMIUM LASER/STONE EXTRACTION/STENT EXCHANGE (Right )     Patient location during evaluation: PACU Anesthesia Type: General Level of consciousness: awake and alert Pain management: pain level controlled Vital Signs Assessment: post-procedure vital signs reviewed and stable Respiratory status: spontaneous breathing, nonlabored ventilation, respiratory function stable and patient connected to nasal cannula oxygen Cardiovascular status: blood pressure returned to baseline and stable Postop Assessment: no apparent nausea or vomiting Anesthetic complications: no   No complications documented.  Last Vitals:  Vitals:   10/09/20 1700 10/09/20 1715  BP: 123/78   Pulse: 72 65  Resp: 17 11  Temp:    SpO2: 99% 96%    Last Pain:  Vitals:   10/09/20 1640  TempSrc:   PainSc: 0-No pain                 Ahnna Dungan L Breiona Couvillon

## 2020-10-09 NOTE — Discharge Instructions (Signed)
Activity:  You are encouraged to ambulate frequently (about every hour during waking hours) to help prevent blood clots from forming in your legs or lungs.     Diet: You should advance your diet as instructed by your physician.  It will be normal to have some bloating, nausea, and abdominal discomfort intermittently.   Prescriptions:  You will be provided a prescription for pain medication to take as needed.  If your pain is not severe enough to require the prescription pain medication, you may take extra strength Tylenol instead which will have less side effects.  You should also take a prescribed stool softener to avoid straining with bowel movements as the prescription pain medication may constipate you.   What to call Raymond Wallace about: You should call the office [phone]) if you develop fever > 101 or develop persistent vomiting. Activity:  You are encouraged to ambulate frequently (about every hour during waking hours) to help prevent blood clots from forming in your legs or lungs.   You have a right ureteral stent draining your right kidney.  You will need to followup in 10-14 days for stent removal in the office. They will call you with appt. If you don't hear anything, please call to obtain appt.  [facility] [phone] Post Ureteroscopy With  Stent Instructions  Definitions:  Ureter: The duct that transports urine from the kidney to the bladder. Stent:   A plastic hollow tube that is placed into the ureter, from the kidney to the bladder to prevent the ureter from swelling shut.  GENERAL INSTRUCTIONS:  Despite the fact that no skin incisions were used, the area around the ureter and bladder is raw and irritated. The stent is a foreign body which will further irritate the bladder wall. This irritation is manifested by increased frequency of urination, both day and night, and by an increase in the urge to urinate. In some, the urge to urinate is present almost always.  Sometimes the urge is strong enough that you may not be able to stop yourself from urinating. The only real cure is to remove the stent and then give time for the bladder wall to heal which can't be done until the danger of the ureter swelling shut has passed, which varies.  You may see some blood in your urine while the stent is in place and a few days afterwards. Do not be alarmed, even if the urine was clear for a while. Get off your feet and drink lots of fluids until clearing occurs. If you start to pass clots or don't improve, call Raymond Wallace.  DIET: You may return to your normal diet immediately. Because of the raw surface of your bladder, alcohol, spicy foods, acid type foods and drinks with caffeine may cause irritation or frequency and should be used in moderation. To keep your urine flowing freely and to avoid constipation, drink plenty of fluids during the day ( 8-10 glasses ). Tip: Avoid cranberry juice because it is very acidic.  ACTIVITY: Your physical activity doesn't need to be restricted. However, if you are very active, you may see some blood in your urine. We suggest that you reduce your activity under these circumstances until the bleeding has stopped.  BOWELS: It is important to keep your bowels regular during the postoperative period. Straining with bowel movements can cause bleeding. A bowel movement every other day is reasonable. Use a mild laxative if needed, such as Milk of Magnesia 2-3 tablespoons, or 2 Dulcolax tablets. Call if  you continue to have problems. If you have been taking narcotics for pain, before, during or after your surgery, you may be constipated. Take a laxative if necessary.   MEDICATION: You should resume your pre-surgery medications unless told not to. In addition you will often be given an antibiotic to prevent infection. These should be taken as prescribed until the bottles are finished unless you are having an unusual reaction to one of the  drugs.  PROBLEMS YOU SHOULD REPORT TO Raymond Wallace:  Fevers over 100.5 Fahrenheit.  Heavy bleeding, or clots ( See above notes about blood in urine ).  Inability to urinate.  Drug reactions ( hives, rash, nausea, vomiting, diarrhea ).  Severe burning or pain with urination that is not improving.  FOLLOW-UP: You will need a follow-up appointment to monitor your progress. Call for this appointment at the number listed above. Usually the first appointment will be about three to fourteen days after your surgery.   Post Anesthesia Home Care Instructions  Activity: Get plenty of rest for the remainder of the day. A responsible individual must stay with you for 24 hours following the procedure.  For the next 24 hours, DO NOT: -Drive a car -Advertising copywriter -Drink alcoholic beverages -Take any medication unless instructed by your physician -Make any legal decisions or sign important papers.  Meals: Start with liquid foods such as gelatin or soup. Progress to regular foods as tolerated. Avoid greasy, spicy, heavy foods. If nausea and/or vomiting occur, drink only clear liquids until the nausea and/or vomiting subsides. Call your physician if vomiting continues.  Special Instructions/Symptoms: Your throat may feel dry or sore from the anesthesia or the breathing tube placed in your throat during surgery. If this causes discomfort, gargle with warm salt water. The discomfort should disappear within 24 hours.    May take Ibuprofen, Advil, Motrin, or Aleve after 10:30 PM as needed.

## 2020-10-09 NOTE — OR Nursing (Signed)
1525 DR GAY REMOVED A RIGHT URINARY STENT VIA CYSTOSCOPY
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
## 2020-10-09 NOTE — Transfer of Care (Signed)
Immediate Anesthesia Transfer of Care Note  Patient: Raymond Wallace  Procedure(s) Performed: CYSTOSCOPY/URETEROSCOPY/HOLMIUM LASER/STONE EXTRACTION/STENT EXCHANGE (Right )  Patient Location: PACU  Anesthesia Type:General  Level of Consciousness: sedated  Airway & Oxygen Therapy: Patient Spontanous Breathing and Patient connected to nasal cannula oxygen  Post-op Assessment: Report given to RN  Post vital signs: Reviewed and stable  Last Vitals:  Vitals Value Taken Time  BP    Temp    Pulse 68 10/09/20 1640  Resp 9 10/09/20 1640  SpO2 98 % 10/09/20 1640  Vitals shown include unvalidated device data.  Last Pain:  Vitals:   10/09/20 1239  TempSrc: Oral  PainSc: 0-No pain      Patients Stated Pain Goal: 5 (10/09/20 1239)  Complications: No complications documented.

## 2020-10-10 ENCOUNTER — Encounter (HOSPITAL_BASED_OUTPATIENT_CLINIC_OR_DEPARTMENT_OTHER): Payer: Self-pay | Admitting: Urology

## 2021-03-11 ENCOUNTER — Ambulatory Visit
Admission: EM | Admit: 2021-03-11 | Discharge: 2021-03-11 | Disposition: A | Discharge status: Home / Self Care | Payer: Medicare Other | Attending: Nurse Practitioner | Admitting: Nurse Practitioner

## 2021-03-11 ENCOUNTER — Encounter: Payer: Self-pay | Admitting: Emergency Medicine

## 2021-03-11 ENCOUNTER — Other Ambulatory Visit: Payer: Self-pay

## 2021-03-11 DIAGNOSIS — K121 Other forms of stomatitis: Secondary | ICD-10-CM

## 2021-03-11 DIAGNOSIS — K0889 Other specified disorders of teeth and supporting structures: Secondary | ICD-10-CM | POA: Diagnosis not present

## 2021-03-11 MED ORDER — AMOXICILLIN 875 MG PO TABS: 875.0000 mg | ORAL_TABLET | Freq: Two times a day (BID) | ORAL | 0 refills | Status: AC
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
## 2021-03-11 NOTE — ED Triage Notes (Signed)
Patient states that he has an infection in his mouth since Friday.  It is more left upper side now moving down to the lower side.  Patient has Listerine in the past but this time it's not helping.  Patient is having a low grade fever and pain.

## 2021-03-11 NOTE — Discharge Instructions (Signed)
Take medications as prescribed.  Finish all antibiotics even if you start to get better.  Continue Listerine mouth rinses.  Do it after meals and before bedtime.  Make sure that you are cleaning your partials after each use.  Follow-up with dentistry.  I have provided 2 different options for you.  Call to make an appointment for follow-up.  I have given you a paper prescription for your antibiotic so that she can take to what ever pharmacy you choose.

## 2021-10-13 IMAGING — CT CT ABD-PELV W/ CM
2 of 5 series · 15 of 46 positions shown, 17 images · IV contrast (APPLIED)
Comparison: None.

CLINICAL DATA: Abdominal pain and constipation for 3 days.

EXAM:
CT ABDOMEN AND PELVIS WITH CONTRAST
TECHNIQUE: Multidetector CT imaging of the abdomen and pelvis was performed
using the standard protocol following bolus administration of
intravenous contrast.
CONTRAST:  75mL OMNIPAQUE IOHEXOL 300 MG/ML  SOLN

[Series 3: abdomen 5.0 · axial · 0.84mm/px · z∈[-364,+1]mm · 12 of 87 slices shown, 14 images]
[im 7/87  soft-tissue]
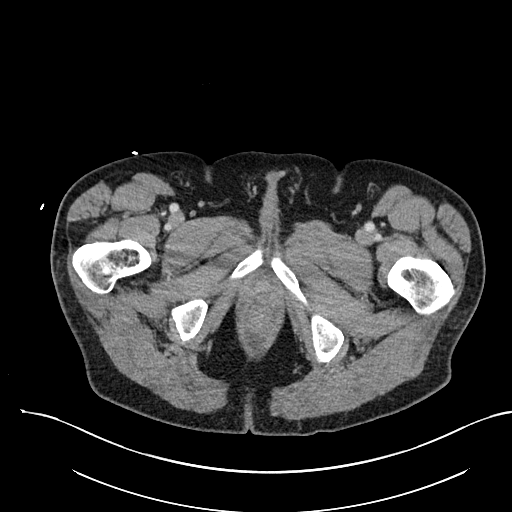
[im 7/87  bone]
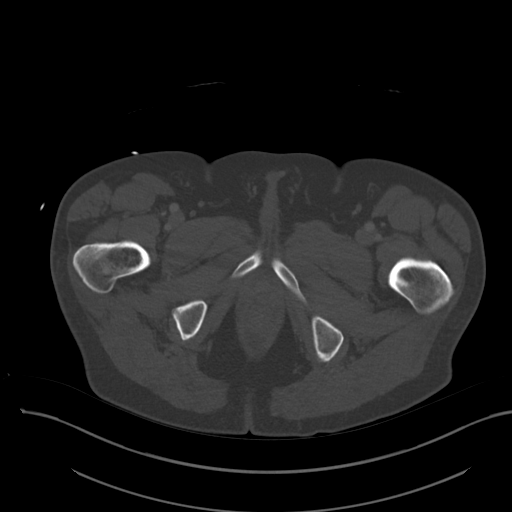
[im 13/87  soft-tissue]
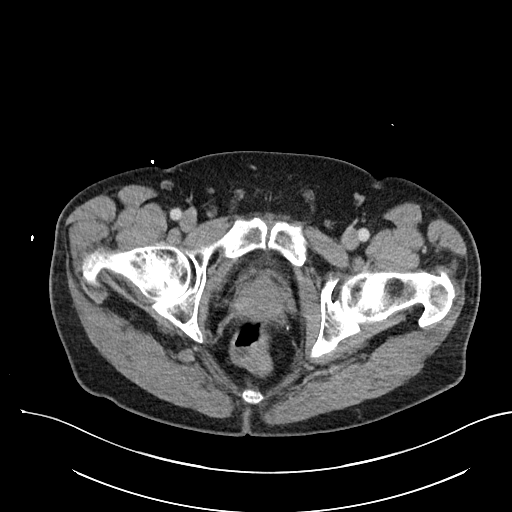
[im 19/87  soft-tissue]
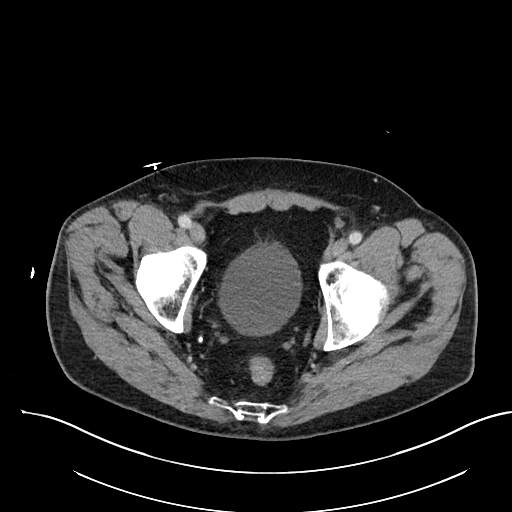
[im 25/87  soft-tissue]
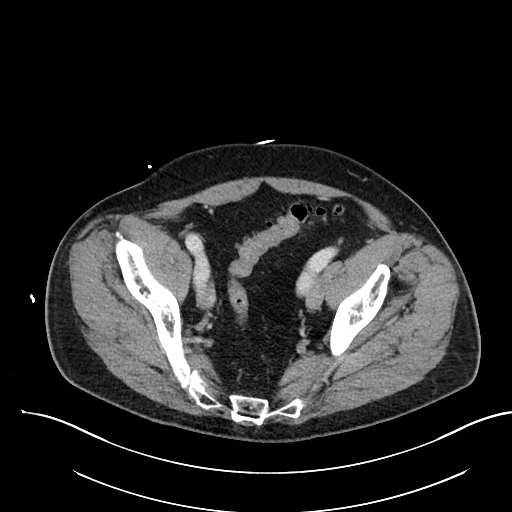
[im 31/87  soft-tissue]
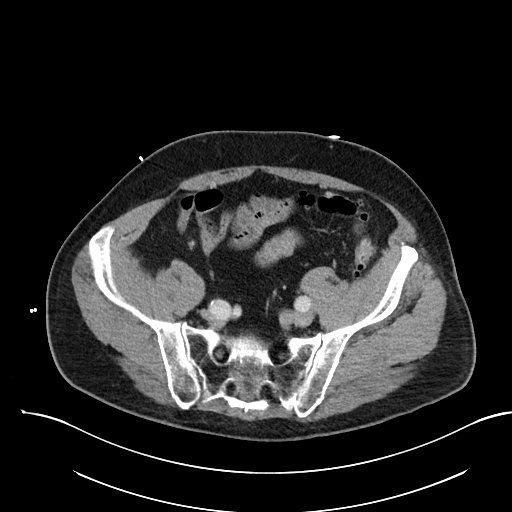
[im 37/87  soft-tissue]
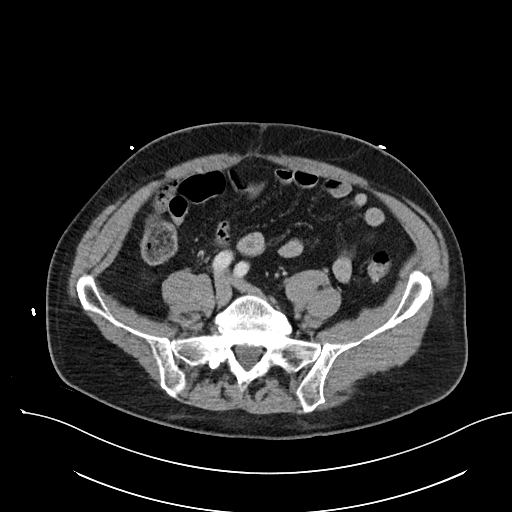
[im 50/87  soft-tissue]
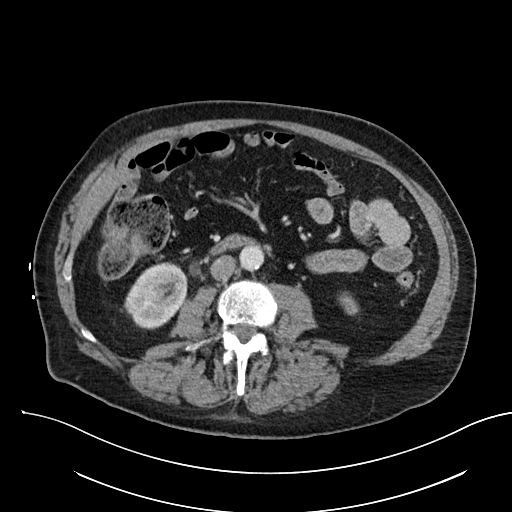
[im 56/87  soft-tissue]
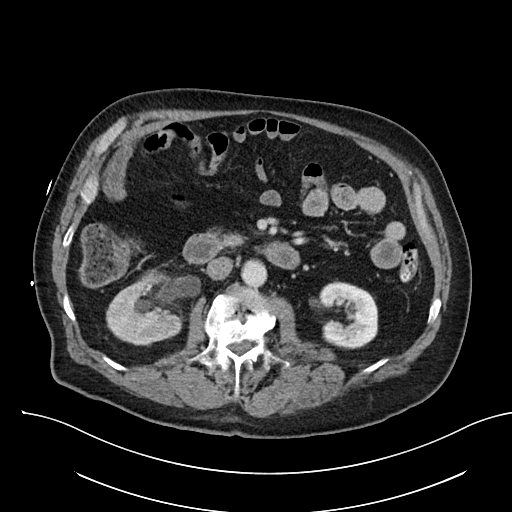
[im 62/87  soft-tissue]
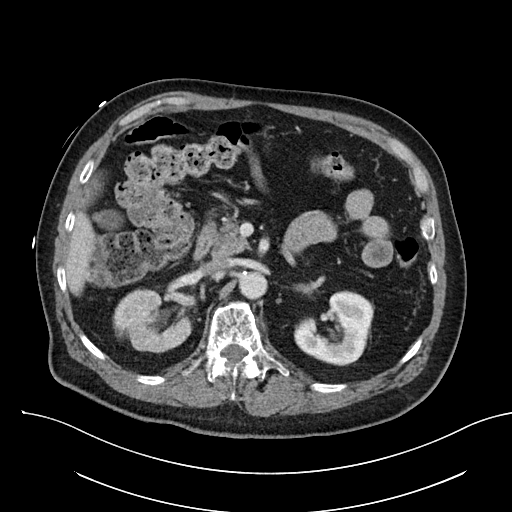
[im 62/87  bone]
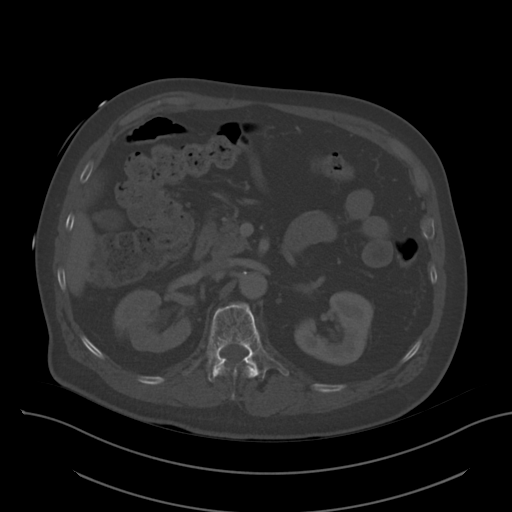
[im 68/87  soft-tissue]
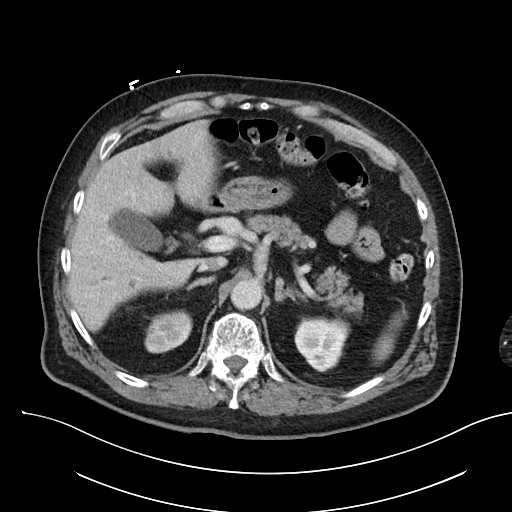
[im 74/87  soft-tissue]
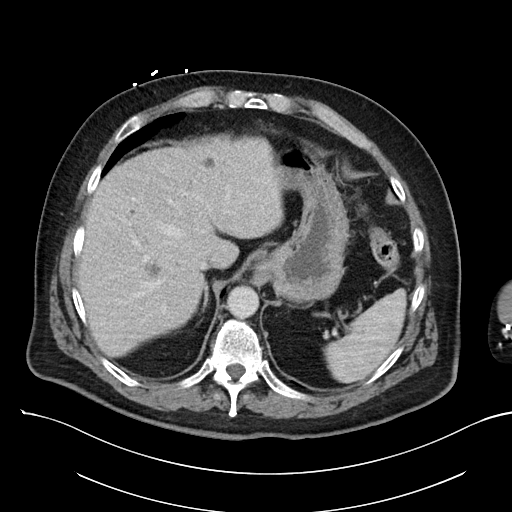
[im 80/87  soft-tissue]
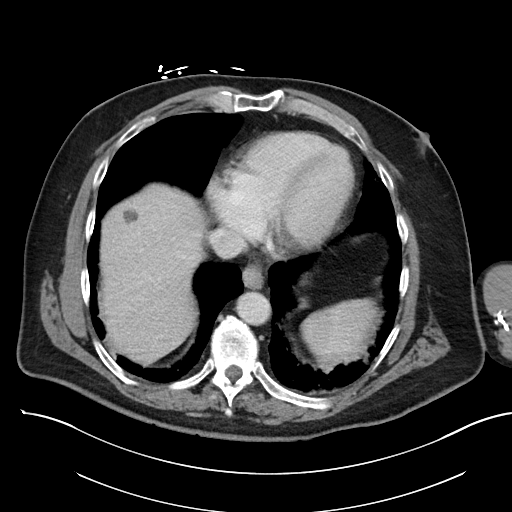

[Series 6: abdomen 3.0 mpr cor · coronal · 0.75mm/px · 3 of 99 slices shown]
[im 33/99  soft-tissue]
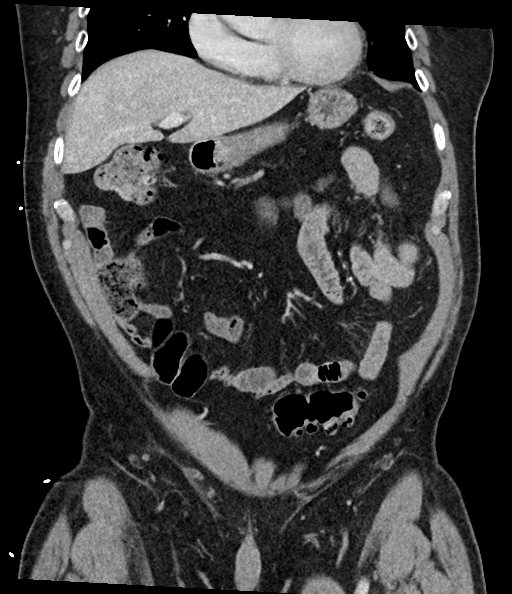
[im 44/99  soft-tissue]
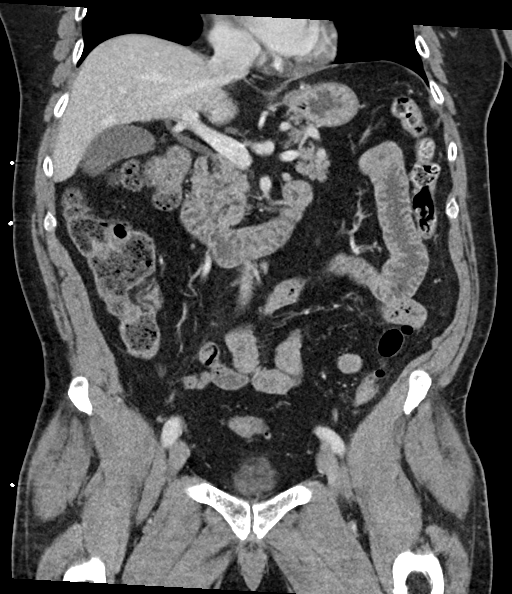
[im 55/99  soft-tissue]
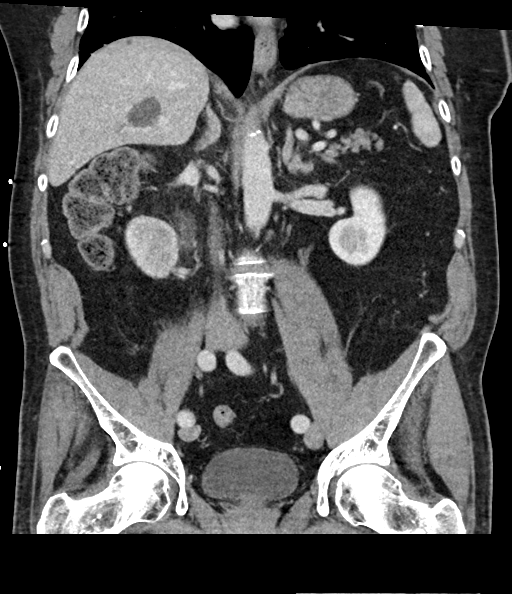

[15 of 46 positions shown; findings below may reference images not displayed]

FINDINGS: Lower chest: Bibasilar subpleural atelectasis. No pleural effusions
or worrisome pulmonary lesions. The heart is normal in size. No
pericardial effusion. Small hiatal hernia is noted.

Hepatobiliary: Small scattered hepatic cysts. No worrisome hepatic
lesions or intrahepatic biliary dilatation. The gallbladder appears
normal. No common bile duct dilatation.

Pancreas: No mass, inflammation or ductal dilatation.

Spleen: Normal size. No focal lesions.

Adrenals/Urinary Tract: The adrenal glands are unremarkable.

Right-sided hydronephrosis and decreased/delayed perfusion of the
right kidney consistent with obstruction. The cause is a 7 x 6 mm
calculus in the proximal mid ureter located at the L3-4 disc space
level. Right renal calculi are also noted. Cortical defects
involving the right kidney likely areas of previous infection.

No left-sided renal or ureteral calculi. Defect involving the lower
pole region likely related to prior infection.

The bladder is unremarkable. No bladder mass or calculi.

Stomach/Bowel: The stomach, duodenum, small bowel and colon
unremarkable. No acute inflammatory changes, mass lesions or
obstructive findings. The terminal ileum is normal. The appendix is
normal. Scattered descending colon and sigmoid colon diverticulosis
but no findings for acute diverticulitis.

Vascular/Lymphatic: Moderate tortuosity and calcification of the
abdominal aorta. No aneurysm or dissection. The branch vessels are
patent.

No mesenteric or retroperitoneal mass or adenopathy.

Reproductive: The prostate gland and seminal vesicles are
unremarkable.

Other: No pelvic mass or adenopathy. No free pelvic fluid
collections. No inguinal mass or adenopathy. No abdominal wall
hernia or subcutaneous lesions.

Musculoskeletal: Remote L2 compression fracture with vertebral plana
appearance and moderate retropulsion and moderate canal compromise.
No acute bony findings. No worrisome bone lesions.
IMPRESSION: 1. 7 x 6 mm proximal mid right ureteral calculus (L3-4 level)
causing moderate to high-grade obstruction.
2. Right renal calculi.
3. No other significant abdominal/pelvic findings, mass lesions or
adenopathy.
4. Remote L2 compression fracture with vertebral plana appearance
and moderate retropulsion and moderate canal compromise.
5. Small hiatal hernia.
6. Aortic atherosclerosis.

Aortic Atherosclerosis ([id number]-HLU.U).

## 2021-10-13 IMAGING — RF DG RETROGRADE PYELOGRAM
1 series · 3 of 3 positions shown · non-contrast
Comparison: CT abdomen/pelvis 08/27/2020

CLINICAL DATA: Retrograde ureteral pyelogram with ureteral stent
placement

EXAM:
RETROGRADE PYELOGRAM

[Series 1: run · 3 of 3 slices shown]
[im 1/3]
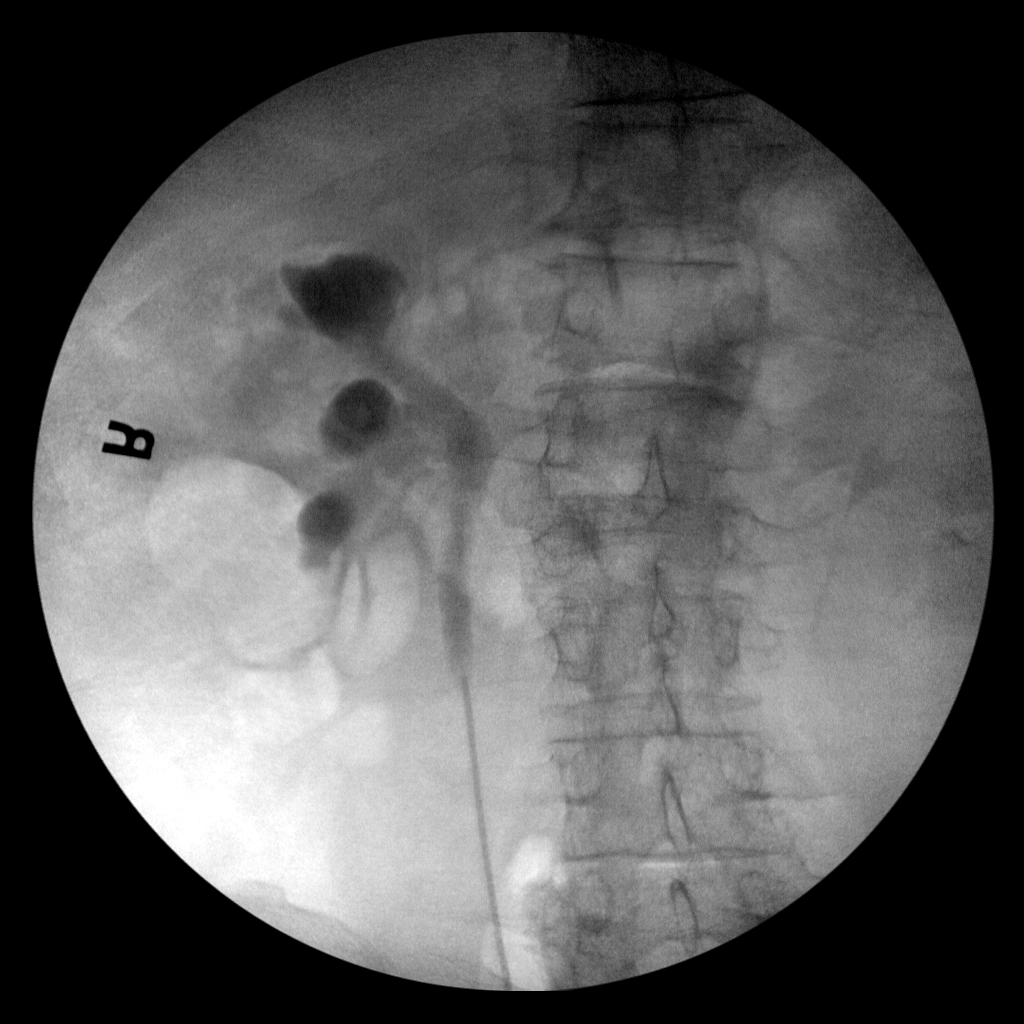
[im 2/3]
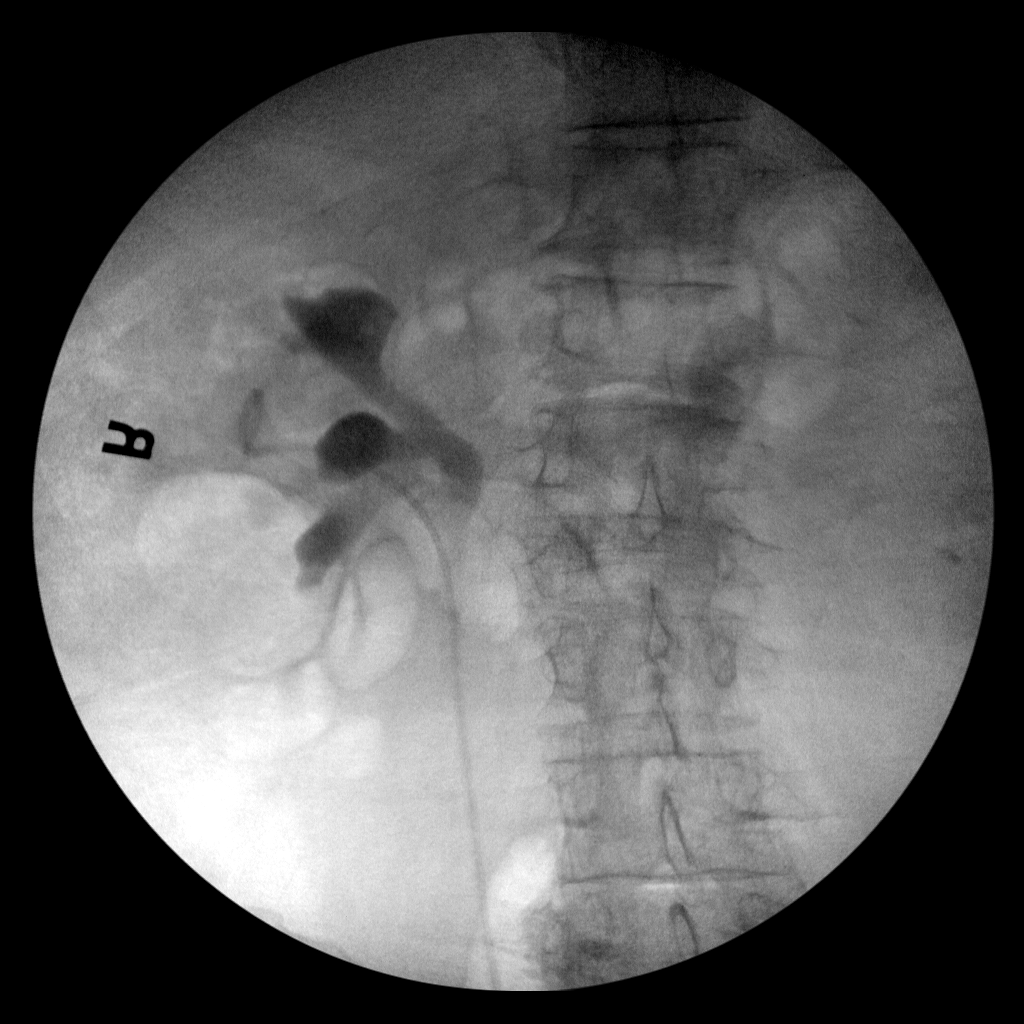
[im 3/3]
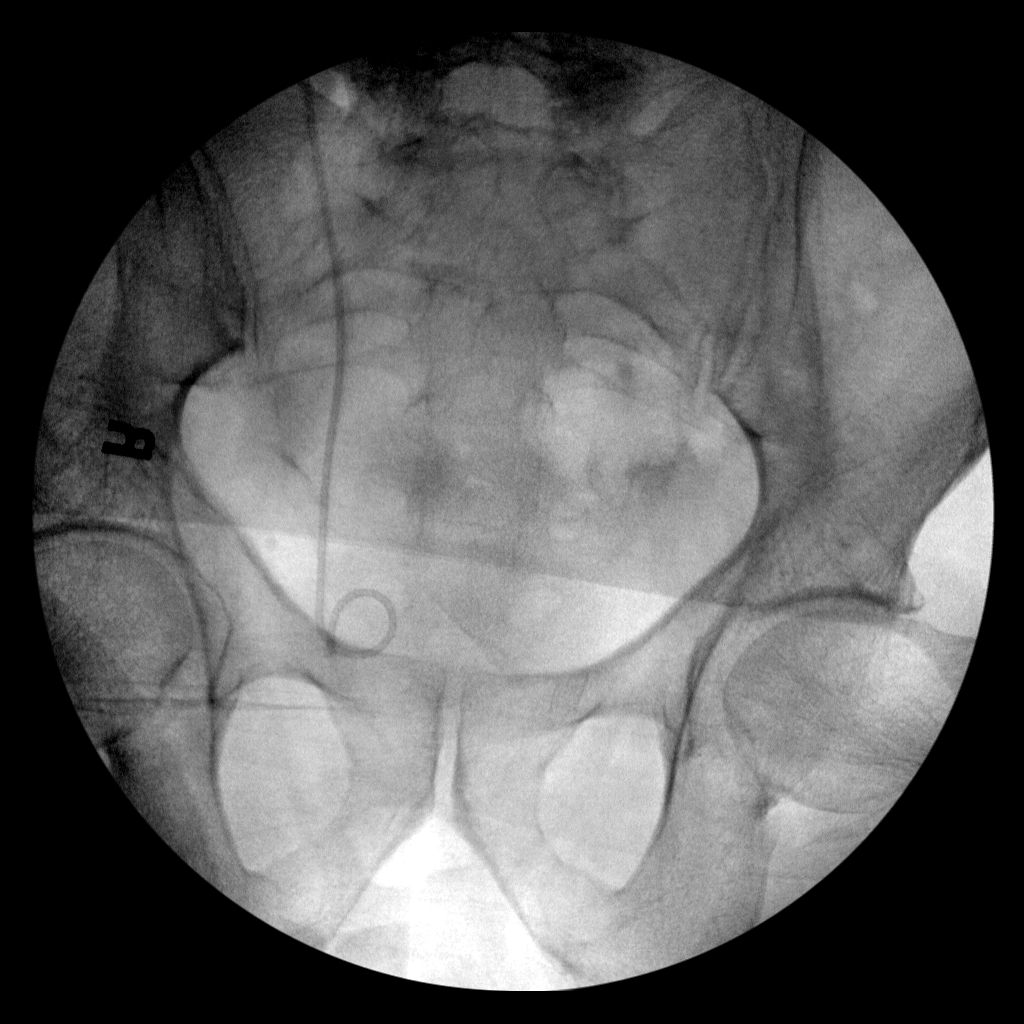

[3 of 3 positions shown; findings below may reference images not displayed]

FINDINGS: Three intraoperative saved images are submitted for review. The
images demonstrate cannulation of the upper right ureter with
ureteropyelogram. There is a moderate hydronephrosis. The subsequent
2 images demonstrate placement of a double-J ureteral stent. The
proximal loop is reconstituted and within an interpolar calyx while
the more inferior loop is reconstituted in overlies the urinary
bladder.
IMPRESSION: 1. Moderate right-sided hydronephrosis.
2. Placement of right-sided double-J ureteral stent as above.

## 2023-01-28 ENCOUNTER — Other Ambulatory Visit: Payer: Self-pay | Admitting: Family Medicine

## 2023-01-28 DIAGNOSIS — R1032 Left lower quadrant pain: Secondary | ICD-10-CM

## 2023-02-03 ENCOUNTER — Ambulatory Visit (INDEPENDENT_AMBULATORY_CARE_PROVIDER_SITE_OTHER): Payer: Medicare Other

## 2023-02-03 DIAGNOSIS — R1032 Left lower quadrant pain: Secondary | ICD-10-CM

## 2023-02-03 LAB — I-STAT CREATININE (MANUAL ENTRY): Creatinine, Ser: 1.3 — AB (ref 0.50–1.10)

## 2023-02-03 MED ORDER — IOHEXOL 300 MG/ML  SOLN: 100.0000 mL | Freq: Once | INTRAMUSCULAR | Status: DC | PRN

## 2023-02-03 MED ORDER — IOHEXOL 300 MG/ML  SOLN
100.0000 mL | Freq: Once | INTRAMUSCULAR | Status: AC | PRN
  Administered 2023-02-03: 100 mL via INTRAVENOUS

## 2023-02-03 MED ORDER — IOHEXOL 9 MG/ML PO SOLN: 500.0000 mL | ORAL | Status: AC

## 2023-12-05 ENCOUNTER — Other Ambulatory Visit (HOSPITAL_COMMUNITY): Payer: Self-pay | Admitting: Family Medicine

## 2023-12-05 DIAGNOSIS — E7800 Pure hypercholesterolemia, unspecified: Secondary | ICD-10-CM

## 2023-12-23 ENCOUNTER — Ambulatory Visit (HOSPITAL_COMMUNITY)
Admission: RE | Admit: 2023-12-23 | Discharge: 2023-12-23 | Disposition: A | Discharge status: Home / Self Care | Payer: Self-pay | Source: Ambulatory Visit | Attending: Family Medicine | Admitting: Family Medicine

## 2023-12-23 DIAGNOSIS — E7800 Pure hypercholesterolemia, unspecified: Secondary | ICD-10-CM | POA: Insufficient documentation
